# Patient Record
Sex: Male | Born: 1935 | Race: White | Hispanic: No | Smoking: Former smoker
Health system: Southern US, Community
[De-identification: ages and names within clinical notes are randomized; demographics above are authoritative.]

## PROBLEM LIST (undated history)

## (undated) DIAGNOSIS — M1990 Unspecified osteoarthritis, unspecified site: Secondary | ICD-10-CM

## (undated) DIAGNOSIS — I639 Cerebral infarction, unspecified: Secondary | ICD-10-CM

## (undated) DIAGNOSIS — I10 Essential (primary) hypertension: Secondary | ICD-10-CM

## (undated) HISTORY — PX: KNEE ARTHROSCOPY: SUR90

## (undated) HISTORY — PX: CHOLECYSTECTOMY: SHX55

## (undated) HISTORY — PX: BRAIN SURGERY: SHX531

---

## 2003-03-30 ENCOUNTER — Emergency Department (HOSPITAL_COMMUNITY): Admission: EM | Admit: 2003-03-30 | Discharge: 2003-03-30 | Payer: Self-pay | Admitting: Emergency Medicine

## 2003-03-31 ENCOUNTER — Inpatient Hospital Stay (HOSPITAL_COMMUNITY): Admission: EM | Admit: 2003-03-31 | Discharge: 2003-04-05 | Payer: Self-pay | Admitting: Emergency Medicine

## 2003-12-27 ENCOUNTER — Ambulatory Visit: Payer: Self-pay | Admitting: Internal Medicine

## 2003-12-27 ENCOUNTER — Ambulatory Visit (HOSPITAL_COMMUNITY): Admission: RE | Admit: 2003-12-27 | Discharge: 2003-12-27 | Payer: Self-pay | Admitting: Internal Medicine

## 2004-01-28 ENCOUNTER — Ambulatory Visit: Payer: Self-pay | Admitting: Family Medicine

## 2004-05-04 ENCOUNTER — Ambulatory Visit: Payer: Self-pay | Admitting: Family Medicine

## 2004-08-11 ENCOUNTER — Ambulatory Visit: Payer: Self-pay | Admitting: Family Medicine

## 2004-08-20 ENCOUNTER — Ambulatory Visit: Payer: Self-pay | Admitting: Orthopedic Surgery

## 2004-11-26 ENCOUNTER — Ambulatory Visit: Payer: Self-pay | Admitting: Family Medicine

## 2005-04-01 ENCOUNTER — Ambulatory Visit: Payer: Self-pay | Admitting: Family Medicine

## 2005-07-08 ENCOUNTER — Ambulatory Visit: Payer: Self-pay | Admitting: Family Medicine

## 2005-07-29 ENCOUNTER — Ambulatory Visit: Payer: Self-pay | Admitting: Family Medicine

## 2005-08-05 ENCOUNTER — Encounter: Admission: RE | Admit: 2005-08-05 | Discharge: 2005-08-16 | Payer: Self-pay | Admitting: Family Medicine

## 2005-08-14 ENCOUNTER — Emergency Department (HOSPITAL_COMMUNITY): Admission: EM | Admit: 2005-08-14 | Discharge: 2005-08-14 | Payer: Self-pay | Admitting: *Deleted

## 2005-08-17 ENCOUNTER — Ambulatory Visit: Payer: Self-pay | Admitting: Family Medicine

## 2005-09-03 ENCOUNTER — Ambulatory Visit: Payer: Self-pay | Admitting: Family Medicine

## 2005-11-02 ENCOUNTER — Ambulatory Visit: Payer: Self-pay | Admitting: Family Medicine

## 2006-05-13 ENCOUNTER — Ambulatory Visit: Payer: Self-pay | Admitting: Family Medicine

## 2011-09-17 NOTE — Patient Instructions (Addendum)
Your procedure is scheduled on: 09/27/2011  Report to [facility] at   730     AM.  Call this number if you have problems the morning of surgery: [phone]   Do not eat food or drink liquids :After Midnight.      Take these medicines the morning of surgery with A SIP OF WATER: lisinopril   Do not wear jewelry, make-up or nail polish.  Do not wear lotions, powders, or perfumes. You may wear deodorant.  Do not shave 48 hours prior to surgery.  Do not bring valuables to the hospital.  Contacts, dentures or bridgework may not be worn into surgery.  Leave suitcase in the car. After surgery it may be brought to your room.  For patients admitted to the hospital, checkout time is 11:00 AM the day of discharge.   Patients discharged the day of surgery will not be allowed to drive home.  :     Please read over the following fact sheets that you were given: Coughing and Deep Breathing, Surgical Site Infection Prevention, Anesthesia Post-op Instructions and Care and Recovery After Surgery    Cataract A cataract is a clouding of the lens of the eye. When a lens becomes cloudy, vision is reduced based on the degree and nature of the clouding. Many cataracts reduce vision to some degree. Some cataracts make people more near-sighted as they develop. Other cataracts increase glare. Cataracts that are ignored and become worse can sometimes look white. The white color can be seen through the pupil. CAUSES   Aging. However, cataracts may occur at any age, even in newborns.   Certain drugs.   Trauma to the eye.   Certain diseases such as diabetes.   Specific eye diseases such as chronic inflammation inside the eye or a sudden attack of a rare form of glaucoma.   Inherited or acquired medical problems.  SYMPTOMS   Gradual, progressive drop in vision in the affected eye.   Severe, rapid visual loss. This most often happens when trauma is the cause.  DIAGNOSIS  To detect a cataract, an eye doctor  examines the lens. Cataracts are best diagnosed with an exam of the eyes with the pupils enlarged (dilated) by drops.  TREATMENT  For an early cataract, vision may improve by using different eyeglasses or stronger lighting. If that does not help your vision, surgery is the only effective treatment. A cataract needs to be surgically removed when vision loss interferes with your everyday activities, such as driving, reading, or watching TV. A cataract may also have to be removed if it prevents examination or treatment of another eye problem. Surgery removes the cloudy lens and usually replaces it with a substitute lens (intraocular lens, IOL).  At a time when both you and your doctor agree, the cataract will be surgically removed. If you have cataracts in both eyes, only one is usually removed at a time. This allows the operated eye to heal and be out of danger from any possible problems after surgery (such as infection or poor wound healing). In rare cases, a cataract may be doing damage to your eye. In these cases, your caregiver may advise surgical removal right away. The vast majority of people who have cataract surgery have better vision afterward. HOME CARE INSTRUCTIONS  If you are not planning surgery, you may be asked to do the following:  Use different eyeglasses.   Use stronger or brighter lighting.   Ask your eye doctor about reducing  your medicine dose or changing medicines if it is thought that a medicine caused your cataract. Changing medicines does not make the cataract go away on its own.   Become familiar with your surroundings. Poor vision can lead to injury. Avoid bumping into things on the affected side. You are at a higher risk for tripping or falling.   Exercise extreme care when driving or operating machinery.   Wear sunglasses if you are sensitive to bright light or experiencing problems with glare.  SEEK IMMEDIATE MEDICAL CARE IF:   You have a worsening or sudden vision  loss.   You notice redness, swelling, or increasing pain in the eye.   You have a fever.  Document Released: 02/01/2005 Document Revised: 01/21/2011 Document Reviewed: 09/25/2010 [facility] Patient Information 2012 [place].PATIENT INSTRUCTIONS POST-ANESTHESIA  IMMEDIATELY FOLLOWING SURGERY:  Do not drive or operate machinery for the first twenty four hours after surgery.  Do not make any important decisions for twenty four hours after surgery or while taking narcotic pain medications or sedatives.  If you develop intractable nausea and vomiting or a severe headache please notify your doctor immediately.  FOLLOW-UP:  Please make an appointment with your surgeon as instructed. You do not need to follow up with anesthesia unless specifically instructed to do so.  WOUND CARE INSTRUCTIONS (if applicable):  Keep a dry clean dressing on the anesthesia/puncture wound site if there is drainage.  Once the wound has quit draining you may leave it open to air.  Generally you should leave the bandage intact for twenty four hours unless there is drainage.  If the epidural site drains for more than 36-48 hours please call the anesthesia department.  QUESTIONS?:  Please feel free to call your physician or the hospital operator if you have any questions, and they will be happy to assist you.

## 2011-09-20 ENCOUNTER — Encounter (HOSPITAL_COMMUNITY): Payer: Self-pay

## 2011-09-20 ENCOUNTER — Encounter (HOSPITAL_COMMUNITY)
Admission: RE | Admit: 2011-09-20 | Discharge: 2011-09-20 | Disposition: A | Discharge status: Home / Self Care | Payer: Medicare Other | Source: Ambulatory Visit | Attending: Ophthalmology | Admitting: Ophthalmology

## 2011-09-20 ENCOUNTER — Encounter (HOSPITAL_COMMUNITY): Payer: Self-pay | Admitting: Pharmacy Technician

## 2011-09-20 ENCOUNTER — Other Ambulatory Visit: Payer: Self-pay

## 2011-09-20 HISTORY — DX: Unspecified osteoarthritis, unspecified site: M19.90

## 2011-09-20 HISTORY — DX: Essential (primary) hypertension: I10

## 2011-09-20 HISTORY — DX: Cerebral infarction, unspecified: I63.9

## 2011-09-20 LAB — BASIC METABOLIC PANEL
Calcium: 9.9 mg/dL (ref 8.4–10.5)
GFR calc Af Amer: >90 mL/min (ref >90)
GFR calc non Af Amer: 79 mL/min — ABNORMAL LOW (ref >90)
Potassium: 4.1 mEq/L (ref 3.5–5.1)
Sodium: 136 mEq/L (ref 135–145)

## 2011-09-20 LAB — HEMOGLOBIN AND HEMATOCRIT, BLOOD: HCT: 42.1 % (ref 39.0–52.0)

## 2011-09-24 MED ORDER — PHENYLEPHRINE HCL 2.5 % OP SOLN
OPHTHALMIC | Status: AC
  Filled 2011-09-24: qty 2

## 2011-09-24 MED ORDER — CYCLOPENTOLATE HCL 1 % OP SOLN
OPHTHALMIC | Status: AC
  Filled 2011-09-24: qty 2

## 2011-09-24 MED ORDER — LIDOCAINE HCL 3.5 % OP GEL
OPHTHALMIC | Status: AC
  Filled 2011-09-24: qty 5

## 2011-09-24 MED ORDER — LIDOCAINE HCL (PF) 1 % IJ SOLN
INTRAMUSCULAR | Status: AC
  Filled 2011-09-24: qty 2

## 2011-09-24 MED ORDER — TETRACAINE HCL 0.5 % OP SOLN
OPHTHALMIC | Status: AC
  Filled 2011-09-24: qty 2

## 2011-09-24 MED ORDER — NEOMYCIN-POLYMYXIN-DEXAMETH 3.5-10000-0.1 OP OINT
TOPICAL_OINTMENT | OPHTHALMIC | Status: AC
  Filled 2011-09-24: qty 3.5

## 2011-09-27 ENCOUNTER — Encounter (HOSPITAL_COMMUNITY): Payer: Self-pay | Admitting: Anesthesiology

## 2011-09-27 ENCOUNTER — Encounter (HOSPITAL_COMMUNITY): Payer: Self-pay | Admitting: Ophthalmology

## 2011-09-27 ENCOUNTER — Encounter (HOSPITAL_COMMUNITY): Payer: Self-pay | Admitting: *Deleted

## 2011-09-27 ENCOUNTER — Encounter (HOSPITAL_COMMUNITY)
Admission: RE | Disposition: A | Discharge status: Home / Self Care | Payer: Self-pay | Source: Ambulatory Visit | Attending: Ophthalmology

## 2011-09-27 ENCOUNTER — Ambulatory Visit (HOSPITAL_COMMUNITY): Payer: Medicare Other | Admitting: Anesthesiology

## 2011-09-27 ENCOUNTER — Ambulatory Visit (HOSPITAL_COMMUNITY)
Admission: RE | Admit: 2011-09-27 | Discharge: 2011-09-27 | Disposition: A | Discharge status: Home / Self Care | Payer: Medicare Other | Source: Ambulatory Visit | Attending: Ophthalmology | Admitting: Ophthalmology

## 2011-09-27 DIAGNOSIS — I10 Essential (primary) hypertension: Secondary | ICD-10-CM | POA: Insufficient documentation

## 2011-09-27 DIAGNOSIS — Z01812 Encounter for preprocedural laboratory examination: Secondary | ICD-10-CM | POA: Insufficient documentation

## 2011-09-27 DIAGNOSIS — Z79899 Other long term (current) drug therapy: Secondary | ICD-10-CM | POA: Insufficient documentation

## 2011-09-27 DIAGNOSIS — E119 Type 2 diabetes mellitus without complications: Secondary | ICD-10-CM | POA: Insufficient documentation

## 2011-09-27 DIAGNOSIS — H2510 Age-related nuclear cataract, unspecified eye: Secondary | ICD-10-CM | POA: Insufficient documentation

## 2011-09-27 DIAGNOSIS — Z01810 Encounter for preprocedural cardiovascular examination: Secondary | ICD-10-CM | POA: Insufficient documentation

## 2011-09-27 HISTORY — PX: CATARACT EXTRACTION W/PHACO: SHX586

## 2011-09-27 SURGERY — PHACOEMULSIFICATION, CATARACT, WITH IOL INSERTION
Anesthesia: Monitor Anesthesia Care | Site: Eye | Laterality: Right | Wound class: Clean

## 2011-09-27 MED ORDER — TETRACAINE HCL 0.5 % OP SOLN
1.0000 [drp] | OPHTHALMIC | Status: AC
  Administered 2011-09-27 (×3): 1 [drp] via OPHTHALMIC

## 2011-09-27 MED ORDER — MIDAZOLAM HCL 2 MG/2ML IJ SOLN
1.0000 mg | INTRAMUSCULAR | Status: DC | PRN
  Administered 2011-09-27: 2 mg via INTRAVENOUS

## 2011-09-27 MED ORDER — EPINEPHRINE HCL 1 MG/ML IJ SOLN
INTRAOCULAR | Status: DC | PRN
  Administered 2011-09-27: 09:00:00

## 2011-09-27 MED ORDER — TRYPAN BLUE 0.06 % OP SOLN
OPHTHALMIC | Status: DC | PRN
  Administered 2011-09-27: 0.5 mL via INTRAOCULAR

## 2011-09-27 MED ORDER — BSS IO SOLN
INTRAOCULAR | Status: DC | PRN
  Administered 2011-09-27: 15 mL via INTRAOCULAR

## 2011-09-27 MED ORDER — LIDOCAINE HCL (PF) 1 % IJ SOLN
INTRAMUSCULAR | Status: DC | PRN
  Administered 2011-09-27: .6 mL

## 2011-09-27 MED ORDER — PROVISC 10 MG/ML IO SOLN
INTRAOCULAR | Status: DC | PRN
  Administered 2011-09-27: 8.5 mg via INTRAOCULAR

## 2011-09-27 MED ORDER — PHENYLEPHRINE HCL 2.5 % OP SOLN
1.0000 [drp] | OPHTHALMIC | Status: AC
  Administered 2011-09-27 (×3): 1 [drp] via OPHTHALMIC

## 2011-09-27 MED ORDER — EPINEPHRINE HCL 1 MG/ML IJ SOLN
INTRAMUSCULAR | Status: AC
  Filled 2011-09-27: qty 1

## 2011-09-27 MED ORDER — LIDOCAINE HCL 3.5 % OP GEL
1.0000 "application " | Freq: Once | OPHTHALMIC | Status: AC
  Administered 2011-09-27: 1 via OPHTHALMIC

## 2011-09-27 MED ORDER — CYCLOPENTOLATE HCL 1 % OP SOLN
1.0000 [drp] | OPHTHALMIC | Status: AC
  Administered 2011-09-27 (×3): 1 [drp] via OPHTHALMIC

## 2011-09-27 MED ORDER — POVIDONE-IODINE 5 % OP SOLN
OPHTHALMIC | Status: DC | PRN
  Administered 2011-09-27: 1 via OPHTHALMIC

## 2011-09-27 MED ORDER — LACTATED RINGERS IV SOLN
INTRAVENOUS | Status: DC | PRN
  Administered 2011-09-27: 08:00:00 via INTRAVENOUS

## 2011-09-27 MED ORDER — TRYPAN BLUE 0.06 % OP SOLN
OPHTHALMIC | Status: AC
  Filled 2011-09-27: qty 0.5

## 2011-09-27 MED ORDER — LACTATED RINGERS IV SOLN
INTRAVENOUS | Status: DC
  Administered 2011-09-27: 1000 mL via INTRAVENOUS

## 2011-09-27 MED ORDER — LIDOCAINE 3.5 % OP GEL OPTIME - NO CHARGE
OPHTHALMIC | Status: DC | PRN
  Administered 2011-09-27: 1 [drp] via OPHTHALMIC

## 2011-09-27 MED ORDER — MIDAZOLAM HCL 2 MG/2ML IJ SOLN
INTRAMUSCULAR | Status: AC
  Filled 2011-09-27: qty 2

## 2011-09-27 MED ORDER — NEOMYCIN-POLYMYXIN-DEXAMETH 0.1 % OP OINT
TOPICAL_OINTMENT | OPHTHALMIC | Status: DC | PRN
  Administered 2011-09-27: 1 via OPHTHALMIC

## 2011-09-27 SURGICAL SUPPLY — 32 items

## 2011-09-27 NOTE — Preoperative (Signed)
Beta Blockers   Reason not to administer Beta Blockers:Not Applicable 

## 2011-09-27 NOTE — Anesthesia Preprocedure Evaluation (Signed)
Anesthesia Evaluation  Patient identified by MRN, date of birth, ID band Patient awake    Reviewed: Allergy & Precautions, H&P , NPO status , Patient's Chart, lab work & pertinent test results  History of Anesthesia Complications Negative for: history of anesthetic complications  Airway Mallampati: II      Dental  (+) Edentulous Upper   Pulmonary  breath sounds clear to auscultation        Cardiovascular hypertension, Pt. on medications Rhythm:Regular     Neuro/Psych CVA, Residual Symptoms    GI/Hepatic   Endo/Other  Well Controlled, Type 2  Renal/GU      Musculoskeletal   Abdominal   Peds  Hematology   Anesthesia Other Findings   Reproductive/Obstetrics                           Anesthesia Physical Anesthesia Plan  ASA: III  Anesthesia Plan: MAC   Post-op Pain Management:    Induction: Intravenous  Airway Management Planned: Nasal Cannula  Additional Equipment:   Intra-op Plan:   Post-operative Plan:   Informed Consent: I have reviewed the patients History and Physical, chart, labs and discussed the procedure including the risks, benefits and alternatives for the proposed anesthesia with the patient or authorized representative who has indicated his/her understanding and acceptance.     Plan Discussed with:   Anesthesia Plan Comments:         Anesthesia Quick Evaluation

## 2011-09-27 NOTE — H&P (Signed)
I have reviewed the H&P, the patient was re-examined, and I have identified no interval changes in medical condition and plan of care since the history and physical of record  

## 2011-09-27 NOTE — Brief Op Note (Signed)
Pre-Op Dx: Cataract OD Post-Op Dx: Cataract OD Surgeon: Judithe Keetch Anesthesia: Topical with MAC Surgery: Cataract Extraction with Intraocular lens Implant OD Implant: Lenstec, Model Softec HD Blood Loss: None Specimen: None Complications: None 

## 2011-09-27 NOTE — Anesthesia Postprocedure Evaluation (Signed)
  Anesthesia Post-op Note  Patient: Shannon Cooke  Procedure(s) Performed: Procedure(s) (LRB): CATARACT EXTRACTION PHACO AND INTRAOCULAR LENS PLACEMENT (IOC) (Right)  Patient Location: Short Stay  Anesthesia Type: MAC  Level of Consciousness: awake, alert  and oriented  Airway and Oxygen Therapy: Patient Spontanous Breathing  Post-op Pain: none  Post-op Assessment: Post-op Vital signs reviewed, Patient's Cardiovascular Status Stable, Respiratory Function Stable, Patent Airway, No signs of Nausea or vomiting and Pain level controlled  Post-op Vital Signs: Reviewed and stable  Complications: No apparent anesthesia complications

## 2011-09-27 NOTE — Transfer of Care (Signed)
Immediate Anesthesia Transfer of Care Note  Patient: Shannon Cooke  Procedure(s) Performed: Procedure(s) (LRB): CATARACT EXTRACTION PHACO AND INTRAOCULAR LENS PLACEMENT (IOC) (Right)  Patient Location: Short Stay  Anesthesia Type: MAC  Level of Consciousness: awake, alert  and oriented  Airway & Oxygen Therapy: Patient Spontanous Breathing  Post-op Assessment: Report given to PACU RN  Post vital signs: Reviewed and stable  Complications: No apparent anesthesia complications

## 2011-09-28 ENCOUNTER — Encounter (HOSPITAL_COMMUNITY): Payer: Self-pay | Admitting: Ophthalmology

## 2011-09-28 NOTE — Op Note (Signed)
[name], Shannon Cooke NO.:  [id number]  MEDICAL RECORD NO.:  [id number]  LOCATION:  [facility]                          FACILITY:  [facility]  PHYSICIAN:  Susanne Greenhouse, MD       DATE OF BIRTH:  Jun 27, 1933  DATE OF PROCEDURE:  09/27/2011 DATE OF DISCHARGE:  09/27/2011                              OPERATIVE REPORT   PREOPERATIVE DIAGNOSIS:  Nuclear cataract, right eye.  POSTOPERATIVE DIAGNOSIS:  Nuclear cataract, right eye.  DIAGNOSIS CODE:  366.16.  ANESTHESIA:  Topical with monitored anesthesia care and IV sedation.  SURGEON:  Susanne Greenhouse, MD  DESCRIPTION OF THE OPERATION:  In the preoperative area, dilating drops and viscous lidocaine were placed into the right eye.  The patient was then brought into the operating room where he was prepped and draped. Beginning with a 75 blade, a paracentesis was made at the surgeon's 2 o'clock position.  The anterior chamber was then filled with a 1% nonpreserved lidocaine solution.  This was followed by filling the anterior chamber with VisionBlue to stain the anterior capsule.  The VisionBlue was displaced from the anterior chamber with balanced salt solution on a fine cannula.  This was followed by filling the anterior chamber with Viscoat.  A 2.4-mm keratome blade was then used to make a clear corneal incision of the temporal limbus.  A bent cystotome needle and Utrata forceps were used to create a continuous tear capsulotomy. Hydrodissection was performed with balanced salt solution on a fine cannula.  A lens nucleus was then removed using phacoemulsification in a quadrant cracking technique.  Residual cortex was removed with irrigation and aspiration.  The capsular bag and anterior chamber were refilled with Provisc and a posterior chamber intraocular lens was placed into the capsular bag without difficulty.  The Provisc was then removed from the capsular bag and anterior chamber with irrigation and aspiration.  Stromal  hydration of the main incision and paracentesis ports were performed with balanced salt solution on a fine cannula.  The wounds were tested for leak, which were negative.  The patient tolerated the procedure well.  There were no operative complications, and he was returned to the recovery room in satisfactory condition.  Prosthetic device used was a Lenstec posterior chamber lens, model Softec HD, power of 18.75, serial number is [id number].  There were no surgical specimens.          ______________________________ Susanne Greenhouse, MD     KEH/MEDQ  D:  09/27/2011  T:  09/28/2011  Job:  [id number]

## 2016-07-13 NOTE — Patient Instructions (Addendum)
Shannon PuntJames R Cooke  07/13/2016     @PREFPERIOPPHARMACY @   Your procedure is scheduled on 07/26/2016 [facility] at [facility]:30 AM  Call this number if you have problems the morning of surgery:  [phone]   Remember:  Do not eat food or drink liquids after midnight.  Take these medicines the morning of surgery with A SIP OF WATER : Lisinopril   Do not wear jewelry, make-up or nail polish.  Do not wear lotions, powders, or perfumes, or deoderant.  Do not shave 48 hours prior to surgery.  Men may shave face and neck.  Do not bring valuables to the hospital.  [facility] is not responsible for any belongings or valuables.  Contacts, dentures or bridgework may not be worn into surgery.  Leave your suitcase in the car.  After surgery it may be brought to your room.  For patients admitted to the hospital, discharge time will be determined by your treatment team.  Patients discharged the day of surgery will not be allowed to drive home.   Name and phone number of your driver:   family Special instructions:  n/a  Please read over the following fact sheets that you were given. Care and Recovery After Surgery    Cataract Surgery Cataract surgery is a procedure to remove a cataract from your eye. A cataract is cloudiness on the lens of your eye. The lens focuses light inside the eye. When a lens becomes cloudy, your vision is affected. Cataract surgery is a procedure to remove the cloudy lens. A substitute lens (intraocular lens or IOL) is usually inserted as a replacement for the cloudy lens. Tell a health care provider about:  Any allergies you have.  All medicines you are taking, including vitamins, herbs, eye drops, creams, and over-the-counter medicines.  Any problems you or family members have had with anesthetic medicines.  Any blood disorders you have.  Any surgeries you have had, especially eye surgeries that include refractive surgery, such as PRK and LASIK.  Any  medical conditions you have.  Whether you are pregnant or may be pregnant. What are the risks? Generally, this is a safe procedure. However, problems may occur, including:  Infection.  Bleeding.  Glaucoma.  Retinal detachment.  Allergic reactions to medicines.  Damage to other structures or organs.  Inflammation of the eye.  Clouding of the part of your eye that holds an IOL in place (after-cataract), if an IOL was inserted. This is fairly common.  An IOL moving out of position, if an IOL was inserted. This is very rare.  Loss of vision. This is rare. What happens before the procedure?  Follow instructions from your health care provider about eating or drinking restrictions.  Ask your health care provider about:  Changing or stopping your regular medicines, including any eye drops you have been prescribed. This is especially important if you are taking diabetes medicines or blood thinners.  Taking medicines such as aspirin and ibuprofen. These medicines can thin your blood. Do not take these medicines before your procedure if your health care provider instructs you not to.  Do not put contact lenses in either eye on the day of your surgery.  Plan for someone to drive you to and from the procedure.  If you will be going home right after the procedure, plan to have someone with you for 24 hours. What happens during the procedure?  An IV tube may be inserted into one of your veins.  You will be given one or more of the following:  A medicine to help you relax (sedative).  A medicine to numb the area (local anesthetic). This may be numbing eye drops or an injection that is given behind the eye.  A small cut (incision) will be made to the edge of the clear, dome-shaped surface that covers the front of the eye (cornea).  A small probe will be inserted into the eye. This device gives off ultrasound waves that soften and break up the cloudy center of the lens. This makes  it easier for the cloudy lens to be removed by suction.  An IOL may be implanted.  Part of the capsule that surrounds the lens will be left in the eye to support the IOL.  Your surgeon may use stitches (sutures) to close the incision. The procedure may vary among health care providers and hospitals. What happens after the procedure?  Your blood pressure, heart rate, breathing rate, and blood oxygen level will be monitored often until the medicines you were given have worn off.  You may be given a protective shield to wear over your eyes.  Do not drive for 24 hours if you received a sedative. This information is not intended to replace advice given to you by your health care provider. Make sure you discuss any questions you have with your health care provider. Document Released: 01/21/2011 Document Revised: 07/10/2015 Document Reviewed: 12/12/2014 Elsevier Interactive Patient Education  2017 [employer].

## 2016-07-15 ENCOUNTER — Encounter (HOSPITAL_COMMUNITY): Payer: Self-pay

## 2016-07-15 ENCOUNTER — Encounter (HOSPITAL_COMMUNITY)
Admission: RE | Admit: 2016-07-15 | Discharge: 2016-07-15 | Disposition: A | Discharge status: Home / Self Care | Payer: Medicare Other | Source: Ambulatory Visit | Attending: Ophthalmology | Admitting: Ophthalmology

## 2016-07-15 DIAGNOSIS — Z01812 Encounter for preprocedural laboratory examination: Secondary | ICD-10-CM | POA: Insufficient documentation

## 2016-07-15 DIAGNOSIS — Z01818 Encounter for other preprocedural examination: Secondary | ICD-10-CM | POA: Diagnosis present

## 2016-07-15 DIAGNOSIS — R9431 Abnormal electrocardiogram [ECG] [EKG]: Secondary | ICD-10-CM | POA: Insufficient documentation

## 2016-07-15 DIAGNOSIS — H2512 Age-related nuclear cataract, left eye: Secondary | ICD-10-CM | POA: Insufficient documentation

## 2016-07-15 LAB — BASIC METABOLIC PANEL
Anion gap: 5 (ref 5–15)
BUN: 20 mg/dL (ref 6–20)
CALCIUM: 9.1 mg/dL (ref 8.9–10.3)
CHLORIDE: 106 mmol/L (ref 101–111)
CO2: 26 mmol/L (ref 22–32)
Creatinine, Ser: 0.87 mg/dL (ref 0.61–1.24)
GFR calc non Af Amer: >60 mL/min (ref >60)
Glucose, Bld: 109 mg/dL — ABNORMAL HIGH (ref 65–99)
Potassium: 4 mmol/L (ref 3.5–5.1)
SODIUM: 137 mmol/L (ref 135–145)

## 2016-07-15 LAB — CBC WITH DIFFERENTIAL/PLATELET
BASOS PCT: 0 %
Basophils Absolute: 0 10*3/uL (ref 0.0–0.1)
EOS ABS: 0.1 10*3/uL (ref 0.0–0.7)
EOS PCT: 1 %
HCT: 39.8 % (ref 39.0–52.0)
Hemoglobin: 13.5 g/dL (ref 13.0–17.0)
LYMPHS ABS: 2.1 10*3/uL (ref 0.7–4.0)
Lymphocytes Relative: 27 %
MCH: 33.4 pg (ref 26.0–34.0)
MCHC: 33.9 g/dL (ref 30.0–36.0)
MCV: 98.5 fL (ref 78.0–100.0)
MONO ABS: 1 10*3/uL (ref 0.1–1.0)
MONOS PCT: 13 %
Neutro Abs: 4.6 10*3/uL (ref 1.7–7.7)
Neutrophils Relative %: 59 %
PLATELETS: 125 10*3/uL — AB (ref 150–400)
RBC: 4.04 MIL/uL — ABNORMAL LOW (ref 4.22–5.81)
RDW: 13.2 % (ref 11.5–15.5)
WBC: 7.7 10*3/uL (ref 4.0–10.5)

## 2016-07-23 MED ORDER — LIDOCAINE HCL (PF) 1 % IJ SOLN
INTRAMUSCULAR | Status: AC
  Filled 2016-07-23: qty 2

## 2016-07-23 MED ORDER — LIDOCAINE HCL 3.5 % OP GEL
OPHTHALMIC | Status: AC
  Filled 2016-07-23: qty 1

## 2016-07-23 MED ORDER — CYCLOPENTOLATE-PHENYLEPHRINE 0.2-1 % OP SOLN
OPHTHALMIC | Status: AC
  Filled 2016-07-23: qty 2

## 2016-07-23 MED ORDER — TETRACAINE HCL 0.5 % OP SOLN
OPHTHALMIC | Status: AC
  Filled 2016-07-23: qty 4

## 2016-07-23 MED ORDER — PHENYLEPHRINE HCL 2.5 % OP SOLN
OPHTHALMIC | Status: AC
  Filled 2016-07-23: qty 15

## 2016-07-23 MED ORDER — NEOMYCIN-POLYMYXIN-DEXAMETH 3.5-10000-0.1 OP SUSP
OPHTHALMIC | Status: AC
  Filled 2016-07-23: qty 5

## 2016-07-26 ENCOUNTER — Encounter (HOSPITAL_COMMUNITY)
Admission: RE | Disposition: A | Discharge status: Home / Self Care | Payer: Self-pay | Source: Ambulatory Visit | Attending: Ophthalmology

## 2016-07-26 ENCOUNTER — Ambulatory Visit (HOSPITAL_COMMUNITY): Payer: Medicare Other | Admitting: Anesthesiology

## 2016-07-26 ENCOUNTER — Ambulatory Visit (HOSPITAL_COMMUNITY)
Admission: RE | Admit: 2016-07-26 | Discharge: 2016-07-26 | Disposition: A | Discharge status: Home / Self Care | Payer: Medicare Other | Source: Ambulatory Visit | Attending: Ophthalmology | Admitting: Ophthalmology

## 2016-07-26 ENCOUNTER — Encounter (HOSPITAL_COMMUNITY): Payer: Self-pay | Admitting: *Deleted

## 2016-07-26 DIAGNOSIS — E119 Type 2 diabetes mellitus without complications: Secondary | ICD-10-CM | POA: Insufficient documentation

## 2016-07-26 DIAGNOSIS — Z87891 Personal history of nicotine dependence: Secondary | ICD-10-CM | POA: Insufficient documentation

## 2016-07-26 DIAGNOSIS — H2512 Age-related nuclear cataract, left eye: Secondary | ICD-10-CM | POA: Insufficient documentation

## 2016-07-26 DIAGNOSIS — Z79899 Other long term (current) drug therapy: Secondary | ICD-10-CM | POA: Insufficient documentation

## 2016-07-26 DIAGNOSIS — I10 Essential (primary) hypertension: Secondary | ICD-10-CM | POA: Diagnosis not present

## 2016-07-26 DIAGNOSIS — Z7982 Long term (current) use of aspirin: Secondary | ICD-10-CM | POA: Insufficient documentation

## 2016-07-26 DIAGNOSIS — M1990 Unspecified osteoarthritis, unspecified site: Secondary | ICD-10-CM | POA: Diagnosis not present

## 2016-07-26 DIAGNOSIS — I6930 Unspecified sequelae of cerebral infarction: Secondary | ICD-10-CM | POA: Insufficient documentation

## 2016-07-26 HISTORY — PX: CATARACT EXTRACTION W/PHACO: SHX586

## 2016-07-26 LAB — GLUCOSE, CAPILLARY: GLUCOSE-CAPILLARY: 90 mg/dL (ref 65–99)

## 2016-07-26 SURGERY — PHACOEMULSIFICATION, CATARACT, WITH IOL INSERTION
Anesthesia: Monitor Anesthesia Care | Site: Eye | Laterality: Left

## 2016-07-26 MED ORDER — BSS IO SOLN
INTRAOCULAR | Status: DC | PRN
  Administered 2016-07-26: 500 mL

## 2016-07-26 MED ORDER — NEOMYCIN-POLYMYXIN-DEXAMETH 3.5-10000-0.1 OP SUSP
OPHTHALMIC | Status: DC | PRN
  Administered 2016-07-26: 1 [drp] via OPHTHALMIC

## 2016-07-26 MED ORDER — MIDAZOLAM HCL 2 MG/2ML IJ SOLN
INTRAMUSCULAR | Status: AC
  Filled 2016-07-26: qty 2

## 2016-07-26 MED ORDER — LIDOCAINE HCL 3.5 % OP GEL
1.0000 "application " | Freq: Once | OPHTHALMIC | Status: AC
  Administered 2016-07-26: 1 via OPHTHALMIC

## 2016-07-26 MED ORDER — LIDOCAINE HCL (PF) 1 % IJ SOLN
INTRAMUSCULAR | Status: DC | PRN
  Administered 2016-07-26: .6 mL

## 2016-07-26 MED ORDER — CYCLOPENTOLATE-PHENYLEPHRINE 0.2-1 % OP SOLN
1.0000 [drp] | OPHTHALMIC | Status: AC
  Administered 2016-07-26 (×3): 1 [drp] via OPHTHALMIC

## 2016-07-26 MED ORDER — TETRACAINE HCL 0.5 % OP SOLN
1.0000 [drp] | OPHTHALMIC | Status: AC
  Administered 2016-07-26 (×3): 1 [drp] via OPHTHALMIC

## 2016-07-26 MED ORDER — FENTANYL CITRATE (PF) 100 MCG/2ML IJ SOLN: 25.0000 ug | Freq: Once | INTRAMUSCULAR | Status: DC

## 2016-07-26 MED ORDER — MIDAZOLAM HCL 2 MG/2ML IJ SOLN
1.0000 mg | INTRAMUSCULAR | Status: AC
  Administered 2016-07-26: 2 mg via INTRAVENOUS

## 2016-07-26 MED ORDER — LACTATED RINGERS IV SOLN
INTRAVENOUS | Status: DC
  Administered 2016-07-26: 09:00:00 via INTRAVENOUS

## 2016-07-26 MED ORDER — FENTANYL CITRATE (PF) 100 MCG/2ML IJ SOLN
INTRAMUSCULAR | Status: AC
  Filled 2016-07-26: qty 2

## 2016-07-26 MED ORDER — BSS IO SOLN
INTRAOCULAR | Status: DC | PRN
  Administered 2016-07-26: 15 mL via INTRAOCULAR

## 2016-07-26 MED ORDER — PHENYLEPHRINE HCL 2.5 % OP SOLN
1.0000 [drp] | OPHTHALMIC | Status: AC
  Administered 2016-07-26 (×3): 1 [drp] via OPHTHALMIC

## 2016-07-26 MED ORDER — POVIDONE-IODINE 5 % OP SOLN
OPHTHALMIC | Status: DC | PRN
  Administered 2016-07-26: 1 via OPHTHALMIC

## 2016-07-26 MED ORDER — NA HYALUR & NA CHOND-NA HYALUR 0.55-0.5 ML IO KIT
PACK | INTRAOCULAR | Status: DC | PRN
  Administered 2016-07-26: 1 via OPHTHALMIC

## 2016-07-26 SURGICAL SUPPLY — 9 items
CLOTH BEACON ORANGE TIMEOUT ST (SAFETY) ×3 IMPLANT
EYE SHIELD UNIVERSAL CLEAR (GAUZE/BANDAGES/DRESSINGS) ×3 IMPLANT
GLOVE BIOGEL PI IND STRL 7.0 (GLOVE) ×2 IMPLANT
GLOVE BIOGEL PI INDICATOR 7.0 (GLOVE) ×4
PAD ARMBOARD 7.5X6 YLW CONV (MISCELLANEOUS) ×3 IMPLANT
SIGHTPATH CAT PROC W REG LENS (Ophthalmic Related) ×3 IMPLANT
SYRINGE LUER LOK 1CC (MISCELLANEOUS) ×3 IMPLANT
TAPE TRANSPARENT 1/2IN (GAUZE/BANDAGES/DRESSINGS) ×3 IMPLANT
WATER STERILE IRR 250ML POUR (IV SOLUTION) ×3 IMPLANT

## 2016-07-26 NOTE — Anesthesia Procedure Notes (Signed)
Procedure Name: MAC Date/Time: 07/26/2016 10:07 AM Performed by: Vista Deck Pre-anesthesia Checklist: Patient identified, Emergency Drugs available, Suction available, Timeout performed and Patient being monitored Patient Re-evaluated:Patient Re-evaluated prior to inductionOxygen Delivery Method: Nasal Cannula

## 2016-07-26 NOTE — H&P (Signed)
I have reviewed the H&P, the patient was re-examined, and I have identified no interval changes in medical condition and plan of care since the history and physical of record  

## 2016-07-26 NOTE — Anesthesia Postprocedure Evaluation (Signed)
Anesthesia Post Note  Patient: Shannon Fredrickson Sr.  Procedure(s) Performed: Procedure(s) (LRB): CATARACT EXTRACTION PHACO AND INTRAOCULAR LENS PLACEMENT ([place]) (Left)  Patient location during evaluation: Short Stay Anesthesia Type: MAC Level of consciousness: awake and alert Pain management: satisfactory to patient Vital Signs Assessment: post-procedure vital signs reviewed and stable Respiratory status: spontaneous breathing Cardiovascular status: stable Postop Assessment: no signs of nausea or vomiting and adequate PO intake Anesthetic complications: no     Last Vitals:  Vitals:   07/26/16 0945 07/26/16 0950  BP: (!) 84/49 (!) 87/52  Resp: 17 16  Temp:      Last Pain:  Vitals:   07/26/16 0905  TempSrc: Oral                 Harold Mattes

## 2016-07-26 NOTE — Transfer of Care (Signed)
Immediate Anesthesia Transfer of Care Note  Patient: Shannon Fredrickson Sr.  Procedure(s) Performed: Procedure(s) (LRB): CATARACT EXTRACTION PHACO AND INTRAOCULAR LENS PLACEMENT (IOC) (Left)  Patient Location: Shortstay  Anesthesia Type: MAC  Level of Consciousness: awake  Airway & Oxygen Therapy: Patient Spontanous Breathing   Post-op Assessment: Report given to PACU RN, Post -op Vital signs reviewed and stable and Patient moving all extremities  Post vital signs: Reviewed and stable  Complications: No apparent anesthesia complications

## 2016-07-26 NOTE — Anesthesia Preprocedure Evaluation (Signed)
Anesthesia Evaluation  Patient identified by MRN, date of birth, ID band Patient awake    Reviewed: Allergy & Precautions, H&P , NPO status , Patient's Chart, lab work & pertinent test results  History of Anesthesia Complications Negative for: history of anesthetic complications  Airway Mallampati: II       Dental  (+) Edentulous Upper   Pulmonary former smoker,    breath sounds clear to auscultation       Cardiovascular hypertension, Pt. on medications  Rhythm:Regular     Neuro/Psych CVA, Residual Symptoms    GI/Hepatic   Endo/Other  diabetes, Well Controlled, Type 2  Renal/GU      Musculoskeletal   Abdominal   Peds  Hematology   Anesthesia Other Findings   Reproductive/Obstetrics                             Anesthesia Physical Anesthesia Plan  ASA: III  Anesthesia Plan: MAC   Post-op Pain Management:    Induction: Intravenous  PONV Risk Score and Plan:   Airway Management Planned: Nasal Cannula  Additional Equipment:   Intra-op Plan:   Post-operative Plan:   Informed Consent: I have reviewed the patients History and Physical, chart, labs and discussed the procedure including the risks, benefits and alternatives for the proposed anesthesia with the patient or authorized representative who has indicated his/her understanding and acceptance.     Plan Discussed with:   Anesthesia Plan Comments:         Anesthesia Quick Evaluation

## 2016-07-26 NOTE — Discharge Instructions (Signed)
Monitored Anesthesia Care, Care After  These instructions provide you with information about caring for yourself after your procedure. Your health care provider may also give you more specific instructions. Your treatment has been planned according to current medical practices, but problems sometimes occur. Call your health care provider if you have any problems or questions after your procedure.  What can I expect after the procedure?  After your procedure, it is common to:   Feel sleepy for several hours.   Feel clumsy and have poor balance for several hours.   Feel forgetful about what happened after the procedure.   Have poor judgment for several hours.   Feel nauseous or vomit.   Have a sore throat if you had a breathing tube during the procedure.    Follow these instructions at home:  For at least 24 hours after the procedure:     Do not:  ? Participate in activities in which you could fall or become injured.  ? Drive.  ? Use heavy machinery.  ? Drink alcohol.  ? Take sleeping pills or medicines that cause drowsiness.  ? Make important decisions or sign legal documents.  ? Take care of children on your own.   Rest.  Eating and drinking   Follow the diet that is recommended by your health care provider.   If you vomit, drink water, juice, or soup when you can drink without vomiting.   Make sure you have little or no nausea before eating solid foods.  General instructions   Have a responsible adult stay with you until you are awake and alert.   Take over-the-counter and prescription medicines only as told by your health care provider.   If you smoke, do not smoke without supervision.   Keep all follow-up visits as told by your health care provider. This is important.  Contact a health care provider if:   You keep feeling nauseous or you keep vomiting.   You feel light-headed.   You develop a rash.   You have a fever.  Get help right away if:   You have trouble breathing.  This information is  not intended to replace advice given to you by your health care provider. Make sure you discuss any questions you have with your health care provider.  Document Released: 05/25/2015 Document Revised: 09/24/2015 Document Reviewed: 05/25/2015  Elsevier Interactive Patient Education  2018 Elsevier Inc[employer]

## 2016-07-26 NOTE — Op Note (Signed)
Date of Admission: 07/26/2016  Date of Surgery: 07/26/2016  Pre-Op Dx: Cataract Left  Eye  Post-Op Dx: Senile Nuclear Cataract  Left  Eye,  Dx Code H25.12  Surgeon: Tonny Branch, M.D.  Assistants: None  Anesthesia: Topical with MAC  Indications: Painless, progressive loss of vision with compromise of daily activities.  Surgery: Cataract Extraction with Intraocular lens Implant Left Eye  Discription: The patient had dilating drops and viscous lidocaine placed into the Left eye in the pre-op holding area. After transfer to the operating room, a time out was performed. The patient was then prepped and draped. Beginning with a 36m blade a paracentesis port was made at the surgeon's 2 o'clock position. The anterior chamber was then filled with 1% non-preserved lidocaine. This was followed by filling the anterior chamber with Viscoat.  A 2.428mkeratome blade was used to make a clear corneal incision at the temporal limbus.  A bent cystatome needle was used to create a continuous tear capsulotomy. Hydrodissection was performed with balanced salt solution on a Fine canula. The lens nucleus was then removed using the phacoemulsification handpiece. Residual cortex was removed with the I&A handpiece. The anterior chamber and capsular bag were refilled with Provisc. A posterior chamber intraocular lens was placed into the capsular bag with it's injector. The implant was positioned with the Kuglan hook. The Provisc was then removed from the anterior chamber and capsular bag with the I&A handpiece. Stromal hydration of the main incision and paracentesis port was performed with BSS on a Fine canula. The wounds were tested for leak which was negative. The patient tolerated the procedure well. There were no operative complications. The patient was then transferred to the recovery room in stable condition.  Complications: None  Specimen: None  EBL: None  Prosthetic device: Abbott Technis, PCB00, power 20.0, SN  [id number]

## 2016-07-28 ENCOUNTER — Encounter (HOSPITAL_COMMUNITY): Payer: Self-pay | Admitting: Ophthalmology

## 2018-09-11 ENCOUNTER — Emergency Department (HOSPITAL_COMMUNITY): Payer: Medicare Other

## 2018-09-11 ENCOUNTER — Inpatient Hospital Stay (HOSPITAL_COMMUNITY)
Admission: EM | Admit: 2018-09-11 | Discharge: 2018-09-20 | DRG: 481 | Disposition: A | Discharge status: Skilled Nursing Facility | Payer: Medicare Other | Attending: Internal Medicine | Admitting: Internal Medicine

## 2018-09-11 ENCOUNTER — Encounter (HOSPITAL_COMMUNITY): Payer: Self-pay | Admitting: Emergency Medicine

## 2018-09-11 ENCOUNTER — Other Ambulatory Visit: Payer: Self-pay

## 2018-09-11 ENCOUNTER — Inpatient Hospital Stay (HOSPITAL_COMMUNITY): Payer: Medicare Other

## 2018-09-11 DIAGNOSIS — D62 Acute posthemorrhagic anemia: Secondary | ICD-10-CM | POA: Diagnosis not present

## 2018-09-11 DIAGNOSIS — I4892 Unspecified atrial flutter: Secondary | ICD-10-CM | POA: Diagnosis not present

## 2018-09-11 DIAGNOSIS — E119 Type 2 diabetes mellitus without complications: Secondary | ICD-10-CM | POA: Diagnosis present

## 2018-09-11 DIAGNOSIS — S72002A Fracture of unspecified part of neck of left femur, initial encounter for closed fracture: Secondary | ICD-10-CM

## 2018-09-11 DIAGNOSIS — I10 Essential (primary) hypertension: Secondary | ICD-10-CM | POA: Diagnosis present

## 2018-09-11 DIAGNOSIS — R5082 Postprocedural fever: Secondary | ICD-10-CM | POA: Diagnosis not present

## 2018-09-11 DIAGNOSIS — Z7984 Long term (current) use of oral hypoglycemic drugs: Secondary | ICD-10-CM

## 2018-09-11 DIAGNOSIS — J9811 Atelectasis: Secondary | ICD-10-CM | POA: Diagnosis not present

## 2018-09-11 DIAGNOSIS — N400 Enlarged prostate without lower urinary tract symptoms: Secondary | ICD-10-CM | POA: Diagnosis present

## 2018-09-11 DIAGNOSIS — Z8673 Personal history of transient ischemic attack (TIA), and cerebral infarction without residual deficits: Secondary | ICD-10-CM

## 2018-09-11 DIAGNOSIS — Z9841 Cataract extraction status, right eye: Secondary | ICD-10-CM | POA: Diagnosis not present

## 2018-09-11 DIAGNOSIS — Z79899 Other long term (current) drug therapy: Secondary | ICD-10-CM

## 2018-09-11 DIAGNOSIS — I639 Cerebral infarction, unspecified: Secondary | ICD-10-CM | POA: Diagnosis present

## 2018-09-11 DIAGNOSIS — Z8781 Personal history of (healed) traumatic fracture: Secondary | ICD-10-CM | POA: Diagnosis not present

## 2018-09-11 DIAGNOSIS — S72141A Displaced intertrochanteric fracture of right femur, initial encounter for closed fracture: Secondary | ICD-10-CM | POA: Diagnosis not present

## 2018-09-11 DIAGNOSIS — W1830XA Fall on same level, unspecified, initial encounter: Secondary | ICD-10-CM | POA: Diagnosis present

## 2018-09-11 DIAGNOSIS — S72142A Displaced intertrochanteric fracture of left femur, initial encounter for closed fracture: Secondary | ICD-10-CM | POA: Diagnosis present

## 2018-09-11 DIAGNOSIS — R509 Fever, unspecified: Secondary | ICD-10-CM

## 2018-09-11 DIAGNOSIS — M1990 Unspecified osteoarthritis, unspecified site: Secondary | ICD-10-CM | POA: Diagnosis present

## 2018-09-11 DIAGNOSIS — Z87891 Personal history of nicotine dependence: Secondary | ICD-10-CM

## 2018-09-11 DIAGNOSIS — Z961 Presence of intraocular lens: Secondary | ICD-10-CM | POA: Diagnosis present

## 2018-09-11 DIAGNOSIS — I480 Paroxysmal atrial fibrillation: Secondary | ICD-10-CM

## 2018-09-11 DIAGNOSIS — Z9842 Cataract extraction status, left eye: Secondary | ICD-10-CM | POA: Diagnosis not present

## 2018-09-11 DIAGNOSIS — Z7902 Long term (current) use of antithrombotics/antiplatelets: Secondary | ICD-10-CM | POA: Diagnosis not present

## 2018-09-11 DIAGNOSIS — Z20828 Contact with and (suspected) exposure to other viral communicable diseases: Secondary | ICD-10-CM | POA: Diagnosis present

## 2018-09-11 DIAGNOSIS — S72002D Fracture of unspecified part of neck of left femur, subsequent encounter for closed fracture with routine healing: Secondary | ICD-10-CM | POA: Diagnosis not present

## 2018-09-11 DIAGNOSIS — M25552 Pain in left hip: Secondary | ICD-10-CM | POA: Diagnosis present

## 2018-09-11 DIAGNOSIS — I4891 Unspecified atrial fibrillation: Secondary | ICD-10-CM | POA: Diagnosis not present

## 2018-09-11 LAB — CBC WITH DIFFERENTIAL/PLATELET
Abs Immature Granulocytes: 0.03 10*3/uL (ref 0.00–0.07)
Basophils Absolute: 0 10*3/uL (ref 0.0–0.1)
Basophils Relative: 0 %
Eosinophils Absolute: 0.1 10*3/uL (ref 0.0–0.5)
Eosinophils Relative: 1 %
HCT: 36 % — ABNORMAL LOW (ref 39.0–52.0)
Hemoglobin: 12.1 g/dL — ABNORMAL LOW (ref 13.0–17.0)
Immature Granulocytes: 0 %
Lymphocytes Relative: 10 %
Lymphs Abs: 1 10*3/uL (ref 0.7–4.0)
MCH: 33.7 pg (ref 26.0–34.0)
MCHC: 33.6 g/dL (ref 30.0–36.0)
MCV: 100.3 fL — ABNORMAL HIGH (ref 80.0–100.0)
Monocytes Absolute: 0.5 10*3/uL (ref 0.1–1.0)
Monocytes Relative: 5 %
Neutro Abs: 7.8 10*3/uL — ABNORMAL HIGH (ref 1.7–7.7)
Neutrophils Relative %: 84 %
Platelets: 126 10*3/uL — ABNORMAL LOW (ref 150–400)
RBC: 3.59 MIL/uL — ABNORMAL LOW (ref 4.22–5.81)
RDW: 13.1 % (ref 11.5–15.5)
WBC: 9.3 10*3/uL (ref 4.0–10.5)
nRBC: 0 % (ref 0.0–0.2)

## 2018-09-11 LAB — BASIC METABOLIC PANEL
Anion gap: 6 (ref 5–15)
BUN: 21 mg/dL (ref 8–23)
CO2: 22 mmol/L (ref 22–32)
Calcium: 8.9 mg/dL (ref 8.9–10.3)
Chloride: 111 mmol/L (ref 98–111)
Creatinine, Ser: 1.17 mg/dL (ref 0.61–1.24)
GFR calc Af Amer: >60 mL/min (ref >60)
GFR calc non Af Amer: 57 mL/min — ABNORMAL LOW (ref >60)
Glucose, Bld: 112 mg/dL — ABNORMAL HIGH (ref 70–99)
Potassium: 4.1 mmol/L (ref 3.5–5.1)
Sodium: 139 mmol/L (ref 135–145)

## 2018-09-11 LAB — ABO/RH: ABO/RH(D): O NEG

## 2018-09-11 LAB — GLUCOSE, CAPILLARY: Glucose-Capillary: 135 mg/dL — ABNORMAL HIGH (ref 70–99)

## 2018-09-11 LAB — PREPARE RBC (CROSSMATCH)

## 2018-09-11 LAB — MAGNESIUM: Magnesium: 2.1 mg/dL (ref 1.7–2.4)

## 2018-09-11 LAB — SARS CORONAVIRUS 2 BY RT PCR (HOSPITAL ORDER, PERFORMED IN ~~LOC~~ HOSPITAL LAB): SARS Coronavirus 2: NEGATIVE

## 2018-09-11 MED ORDER — ACETAMINOPHEN 650 MG RE SUPP: 650.0000 mg | Freq: Four times a day (QID) | RECTAL | Status: DC | PRN

## 2018-09-11 MED ORDER — MUPIROCIN 2 % EX OINT
1.0000 "application " | TOPICAL_OINTMENT | Freq: Two times a day (BID) | CUTANEOUS | Status: AC
  Administered 2018-09-12 – 2018-09-16 (×9): 1 via NASAL
  Filled 2018-09-11: qty 22

## 2018-09-11 MED ORDER — HYDROMORPHONE HCL 1 MG/ML IJ SOLN
1.0000 mg | Freq: Once | INTRAMUSCULAR | Status: AC
  Administered 2018-09-11: 1 mg via INTRAVENOUS
  Filled 2018-09-11: qty 1

## 2018-09-11 MED ORDER — MORPHINE SULFATE (PF) 2 MG/ML IV SOLN: 2.0000 mg | INTRAVENOUS | Status: DC | PRN

## 2018-09-11 MED ORDER — CEFAZOLIN SODIUM-DEXTROSE 2-4 GM/100ML-% IV SOLN
2.0000 g | Freq: Once | INTRAVENOUS | Status: AC
  Administered 2018-09-12: 11:00:00 2 g via INTRAVENOUS
  Filled 2018-09-11: qty 100

## 2018-09-11 MED ORDER — SODIUM CHLORIDE 0.9 % IV SOLN
INTRAVENOUS | Status: DC
  Administered 2018-09-11: 75 mL/h via INTRAVENOUS
  Administered 2018-09-12: 08:00:00 via INTRAVENOUS

## 2018-09-11 MED ORDER — SODIUM CHLORIDE 0.9% IV SOLUTION: Freq: Once | INTRAVENOUS | Status: DC

## 2018-09-11 MED ORDER — INSULIN ASPART 100 UNIT/ML ~~LOC~~ SOLN
0.0000 [IU] | Freq: Four times a day (QID) | SUBCUTANEOUS | Status: DC
  Administered 2018-09-11 – 2018-09-12 (×2): 1 [IU] via SUBCUTANEOUS

## 2018-09-11 MED ORDER — SODIUM CHLORIDE 0.9 % IV SOLN
INTRAVENOUS | Status: DC
  Administered 2018-09-11: 17:00:00 via INTRAVENOUS

## 2018-09-11 MED ORDER — POVIDONE-IODINE 10 % EX SWAB: 2.0000 "application " | Freq: Once | CUTANEOUS | Status: DC

## 2018-09-11 MED ORDER — ACETAMINOPHEN 325 MG PO TABS
650.0000 mg | ORAL_TABLET | Freq: Four times a day (QID) | ORAL | Status: DC | PRN
  Administered 2018-09-18: 650 mg via ORAL
  Filled 2018-09-11: qty 2

## 2018-09-11 MED ORDER — ONDANSETRON HCL 4 MG PO TABS: 4.0000 mg | ORAL_TABLET | Freq: Four times a day (QID) | ORAL | Status: DC | PRN

## 2018-09-11 MED ORDER — CHLORHEXIDINE GLUCONATE 4 % EX LIQD
60.0000 mL | Freq: Once | CUTANEOUS | Status: AC
  Administered 2018-09-12: 4 via TOPICAL
  Filled 2018-09-11: qty 60

## 2018-09-11 MED ORDER — POLYETHYLENE GLYCOL 3350 17 G PO PACK: 17.0000 g | PACK | Freq: Every day | ORAL | Status: DC | PRN

## 2018-09-11 MED ORDER — ONDANSETRON HCL 4 MG/2ML IJ SOLN: 4.0000 mg | Freq: Four times a day (QID) | INTRAMUSCULAR | Status: DC | PRN

## 2018-09-11 NOTE — ED Notes (Signed)
Patient transported to X-ray 

## 2018-09-11 NOTE — H&P (Signed)
History and Physical    Shannon CaulJames R Wronski Sr. ZOX:[id number] DOB: [birth year] DOA: 09/11/2018  PCP: Joette CatchingNyland, Leonard, MD   Patient coming from: Home  I have personally briefly [facility]  Chief Complaint: Fall  HPI: Shannon CaulJames R Reesman Sr. is a 85 y.o. male with medical history significant for remote stroke, hypertension, diabetes mellitus, who was brought to the ED after patient fell today.  Patient appears slightly confused-but per care everywhere on his visits, he is sometimes confused, but he is able to answer a few questions appropriately. Patient tells me he was trying to get out of his friend's car, when he fell.  He says he might have slipped.  He tells me he hit his head a bit.  He denies any chest pain or difficulty breathing.  Denies dizziness, cough, fever or chills, vomiting or loose stools.  ED Course: Blood pressure systolic 101-106, heart rate 70s to 80s.  Slight drop in hemoglobin 12.1 from 13.52 years ago otherwise unremarkable CBC, BMP.  EKG shows sinus tachycardia,.  Portable chest x-ray without acute abnormality.  Pelvic x-ray shows comminuted intertrochanteric fracture of the proximal left femur.  Hospitalist to admit for hip fracture.  Review of Systems: As per HPI all other systems reviewed and negative.  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 His smoking use included cigars. He quit after 5.00 years of use. He has never used smokeless tobacco. He reports that he does not drink alcohol or use drugs.  No Known Allergies  Family history of hypertension.  Prior to Admission medications   Medication Sig Start Date End Date Taking? Authorizing Provider  dipyridamole-aspirin (AGGRENOX) 200-25 MG per 12 hr capsule Take 1 capsule by mouth 2 (two) times daily. Morning & supper   Yes [provider]  Folic Acid-Vit B6-Vit B12 (FOLBEE PO) Take 1 tablet by mouth daily.   Yes [provider]  glipiZIDE (GLUCOTROL XL) 2.5 MG 24 hr tablet Take 2.5 mg by mouth daily with breakfast.   Yes [provider]  lisinopril (ZESTRIL) 10 MG tablet TAKE ONE TABLET (10 MG DOSE) BY MOUTH DAILY. 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  Mucous membranes are moist. Posterior pharynx clear of any exudate or lesions. Neck: normal, supple, no masses, no thyromegaly Respiratory: clear to auscultation bilaterally, no wheezing, no crackles. Normal respiratory effort. No accessory muscle use.  Cardiovascular: Regular rate and rhythm, no murmurs / rubs  / gallops. No extremity edema. 2+ pedal pulses. Abdomen: no tenderness, no masses palpated. No hepatosplenomegaly. Bowel sounds positive.  Musculoskeletal: no clubbing / cyanosis. No joint deformity upper and lower extremities. Good ROM, no contractures. Normal muscle tone.  Skin: no rashes, lesions, ulcers. No induration Neurologic: CN 2-12 grossly intact.  Strength 5/5 in all extremities except left lower extremity not tested. Psychiatric: Normal judgment and insight. Alert and oriented x 3. Normal mood.   Labs on Admission: I have personally reviewed following labs and imaging studies  CBC: Recent Labs  Lab 09/11/18 1607  WBC 9.3  NEUTROABS 7.8*  HGB 12.1*  HCT 36.0*  MCV 100.3*  PLT 242*   Basic Metabolic Panel: Recent Labs  Lab 09/11/18 1607  NA 139  K 4.1  CL 111  CO2 22  GLUCOSE 112*  BUN 21  CREATININE 1.17  CALCIUM 8.9    Radiological Exams on Admission: Dg Chest 1 View  Result Date: 09/11/2018 CLINICAL DATA:  Pre operative respiratory exam.  Left hip fracture. EXAM: CHEST  1 VIEW COMPARISON:  None. FINDINGS: The heart size and mediastinal contours are within normal limits. Both lungs are clear. The visualized skeletal structures are unremarkable. IMPRESSION: No active disease. Electronically Signed   By: Lorriane Shire M.D.   On: 09/11/2018 16:43   Dg Hip Unilat W Or Wo Pelvis 2-3 Views Left  Result Date: 09/11/2018 CLINICAL DATA:  Left hip pain secondary to a fall today. EXAM: DG HIP (WITH OR WITHOUT PELVIS) 2-3V LEFT COMPARISON:  None. FINDINGS: There is a comminuted intertrochanteric fracture proximal left femur with slight distraction. Diffuse osteopenia. Superolateral joint space narrowing at the left hip. Pelvic bones are intact. IMPRESSION: Comminuted intertrochanteric fracture of the proximal left femur. Electronically Signed   By: Lorriane Shire M.D.   On: 09/11/2018 16:42    EKG: Independently reviewed.  Sinus tachycardia, prolonged QTC 504.  No  significant changes from prior.  Assessment/Plan Active Problems:   Closed left hip fracture (HCC)  Left femur fracture-sustained from mechanical fall.  Patient denies cardiorespiratory or neurologic symptoms.  Pelvic x-ray shows a comminuted third intertrochanteric fracture involving the proximal left femur. -Talked to Ortho, Dr. Aline Brochure, he will see patient. -N.p.o. midnight -IV morphine 2 mg PRN -Patient tells me he hit his head, obtain stat head CT - N/s 75cc/hr x 12hrs  Prolonged QTC-prolonged at 504.  Potassium 4.1. -Monitor on telemetry, check magnesium  Hypertension- systolic 683-419. -Home lisinopril -Hydrate.  Diabetes mellitus-random glucose 112 - SSI -Hold home glipizide  Stroke-remote history. -Hold home Aggrenox.  DVT prophylaxis: SCDS Code Status: Full Family Communication: None at bedside Disposition Plan: Per rounding team Consults called: Orthopedics Admission status: Inpt, tele I certify that at the point of admission it is my clinical judgment that the patient will require inpatient hospital care spanning beyond 2 midnights from  the point of admission due to high intensity of service, high risk for further deterioration and high frequency of surveillance required. The following factors support the patient status of inpatient: Left femur fracture requiring surgery.   Onnie BoerEjiroghene E Dawnyel Leven MD [facility]  09/11/2018, 6:42 PM

## 2018-09-11 NOTE — Consult Note (Signed)
Shannon Cooke    Patient ID: Shannon Cooke., male   DOB: 1933/09/03, 85 y.o.   MRN: [id number]  New patient  Requested by:  Reason for:   Based on the information below I recommend open treatment internal fixation with dynamic hip screw  Chief Complaint  Patient presents with  .  Painful left hip     Tyjai Cooke. is a 85 y.o. male.    HPI  85 year old male uses a walker at home lives alone was getting out of a car without his walker fell noted acute pain in his left hip and then he could not walk  Duration injury date was July 27  Severity 10 out of 10  Pain quality dull ache  Modified by movement with worsening pain   Review of Systems  Respiratory: Negative for shortness of breath.   Cardiovascular: Negative for chest pain.  Musculoskeletal: Positive for falls, joint pain and myalgias.  Psychiatric/Behavioral: Positive for memory loss.  All other systems reviewed and are negative.   Past Medical History:  Diagnosis Date  . Arthritis   . Diabetes mellitus    diet controlled  . Hypertension   . Stroke [facility])    no deficits    Past Surgical History:  Procedure Laterality Date  . 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 CATARACT EXTRACTION W/PHACO  09/27/2011   Procedure: CATARACT EXTRACTION PHACO AND INTRAOCULAR LENS PLACEMENT (IOC);  Surgeon: Tonny Branch, MD;  Location: [facility];  Service: Ophthalmology;  Laterality: Right;  CDE:82.67  . CATARACT EXTRACTION W/PHACO Left 07/26/2016   Procedure: CATARACT EXTRACTION PHACO AND INTRAOCULAR LENS PLACEMENT (IOC);  Surgeon: Tonny Branch, MD;  Location: [facility];  Service: Ophthalmology;  Laterality: Left;  CDE: 52.55  . CHOLECYSTECTOMY     [facility]  . KNEE ARTHROSCOPY     bilateral    History reviewed. No pertinent family history.  Family history noncontributory to present illness Social History Social History   Tobacco Use  . Smoking status: Former Smoker     Years: 5.00    Types: Cigars    Quit date: 09/19/1988    Years since quitting: 29.9  . Smokeless tobacco: Never Used  Substance Use Topics  . Alcohol use: No  . Drug use: No     No Known Allergies  Current Facility-Administered Medications  Medication Dose Route Frequency Provider Last Rate Last Dose  . 0.9 %  sodium chloride infusion   Intravenous Continuous Virgel Manifold, MD 100 mL/hr at 09/11/18 1659     Current Outpatient Medications  Medication Sig Dispense Refill  . dipyridamole-aspirin (AGGRENOX) 200-25 MG per 12 hr capsule Take 1 capsule by mouth 2 (two) times daily. Morning & supper    . Folic Acid-Vit Y8-MVH Q46 (FOLBEE PO) Take 1 tablet by mouth daily.    Marland Kitchen glipiZIDE (GLUCOTROL XL) 2.5 MG 24 hr tablet Take 2.5 mg by mouth daily with breakfast.    . lisinopril (ZESTRIL) 10 MG tablet TAKE ONE TABLET (10 MG DOSE) BY MOUTH DAILY.       Physical Exam(=30) BP 106/66   Pulse 72   Ht 6\' 1"  (1.854 m)   Wt 77.1 kg   SpO2 97%   BMI 22.43 kg/m   Gen. Appearance he appears frail to me thin without any muscle wasting Peripheral vascular system no major deficits no edema Lymph nodes groin area normal Gait unable to walk secondary to  hip fracture  Left Upper extremity  Inspection revealed no malalignment or asymmetry  Assessment of range of motion: Full range of motion was recorded  Assessment of stability: Elbow wrist and hand and shoulder were stable  Assessment of muscle strength and tone revealed grade 5 muscle strength and normal muscle tone  Skin was normal without rash lesion or ulceration  Right upper extremity  Inspection revealed no malalignment or asymmetry  Assessment of range of motion: Full range of motion was recorded  Assessment of stability: Elbow wrist and hand and shoulder were stable  Assessment of muscle strength and tone revealed grade 5 muscle strength and normal muscle tone  Skin was normal without rash lesion or ulceration  Right Lower  extremity  Inspection revealed no malalignment or asymmetry  Assessment of range of motion: Full range of motion was recorded  Assessment of stability: Ankle, knee and hip were stable  Assessment of muscle strength and tone revealed grade 5 muscle strength and normal muscle tone  Skin was normal without rash lesion or ulceration  Left lower extremity T proximal thigh R no range of motion in the hip secondary to fracture I stability of hip could not be assessed because of fracture M normal muscle tone no tremor Skin was normal without rash lesion or ulceration  Coordination was tested by finger-to-nose nose and was normal Deep tendon reflexes were 2+ in the upper extremities  Examination of sensation by touch was normal  Mental status  Oriented to time person and place normal  Mood and affect normal without depression anxiety or agitation  Dx: Left hip fracture intertrochanteric  Data Reviewed  ER RECORD REVIEWED: CONFIRMS HISTORY   I reviewed the following images and the reports and my independent interpretation is left intertrochanteric fracture minimal displacement   Assessment/Plan  85-year-old male ambulates with a walker sometimes in the community lives alone fractured his left hip.  Recommend internal fixation with dynamic hip screw  The procedure has been fully reviewed with the patient; The risks and benefits of surgery have been discussed and explained and understood. Alternative treatment has also been reviewed, questions were encouraged and answered. The postoperative plan is also been reviewed.      E  MD  

## 2018-09-11 NOTE — Progress Notes (Signed)
CHB bath completed.  MRSA swab completed.

## 2018-09-11 NOTE — ED Triage Notes (Signed)
Pt fell today trying to get into his friends car.  Injury to his left wrist and left hip.  Slight rotation and shortening to his left hip and pain with movement.  Skin tear to his left wrist.

## 2018-09-11 NOTE — Plan of Care (Signed)
  Problem: Nutrition: Goal: Adequate nutrition will be maintained Outcome: Progressing   Problem: Clinical Measurements: Goal: Ability to maintain clinical measurements within normal limits will improve Outcome: Progressing   Problem: Pain Managment: Goal: General experience of comfort will improve Outcome: Progressing

## 2018-09-11 NOTE — ED Provider Notes (Signed)
[facility] EMERGENCY DEPARTMENT Provider Note   CSN: [id number] Arrival date & time: 09/11/18  1521     History   Chief Complaint Chief Complaint  Patient presents with  . Hip Pain    HPI Shannon Cooke. is a 85 y.o. male.     HPI   85yM with L hip pain. Mechanical fall earlier today and severe/persistent L hip pain since. Denies any other injuries. Not anticoagulated. No numbness or tingling.   Past Medical History:  Diagnosis Date  . Arthritis   . Diabetes mellitus    diet controlled  . Hypertension   . Stroke [facility])    no deficits    There are no active problems to display for this patient.   Past Surgical History:  Procedure Laterality Date  . 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 CATARACT EXTRACTION W/PHACO  09/27/2011   Procedure: CATARACT EXTRACTION PHACO AND INTRAOCULAR LENS PLACEMENT (IOC);  Surgeon: Tonny Branch, MD;  Location: [facility];  Service: Ophthalmology;  Laterality: Right;  CDE:82.67  . CATARACT EXTRACTION W/PHACO Left 07/26/2016   Procedure: CATARACT EXTRACTION PHACO AND INTRAOCULAR LENS PLACEMENT (IOC);  Surgeon: Tonny Branch, MD;  Location: [facility];  Service: Ophthalmology;  Laterality: Left;  CDE: 52.55  . CHOLECYSTECTOMY     [facility]  . KNEE ARTHROSCOPY     bilateral        Home Medications    Prior to Admission medications   Medication Sig Start Date End Date Taking? Authorizing Provider  dipyridamole-aspirin (AGGRENOX) 200-25 MG per 12 hr capsule Take 1 capsule by mouth 2 (two) times daily. Morning & supper    [provider]  Folic Acid-Vit X5-QMG Q67 (FOLBEE PO) Take 1 tablet by mouth daily.    [provider]  lisinopril (PRINIVIL,ZESTRIL) 5 MG tablet Take 5 mg by mouth daily.    [provider]  Omega-3 Fatty Acids (FISH OIL) 1000 MG CAPS Take 1,000 mg by mouth 2 (two) times daily. Morning & supper    [provider]    Family History History reviewed. No pertinent  family history.  Social History Social History   Tobacco Use  . Smoking status: Former Smoker    Years: 5.00    Types: Cigars    Quit date: 09/19/1988    Years since quitting: 29.9  . Smokeless tobacco: Never Used  Substance Use Topics  . Alcohol use: No  . Drug use: No     Allergies   Patient has no known allergies.   Review of Systems Review of Systems  All systems reviewed and negative, other than as noted in HPI.  Physical Exam Updated Vital Signs Ht 6\' 1"  (1.854 m)   Wt 77.1 kg   BMI 22.43 kg/m   Physical Exam Vitals signs and nursing note reviewed.  Constitutional:      General: He is not in acute distress.    Appearance: He is well-developed.  HENT:     Head: Normocephalic and atraumatic.  Eyes:     General:        Right eye: No discharge.        Left eye: No discharge.     Conjunctiva/sclera: Conjunctivae normal.  Neck:     Musculoskeletal: Neck supple.  Cardiovascular:     Rate and Rhythm: Normal rate and regular rhythm.     Heart sounds: Normal heart sounds. No murmur. No friction rub. No gallop.  Pulmonary:     Effort: Pulmonary effort is normal. No respiratory distress.     Breath sounds: Normal breath sounds.  Abdominal:     General: There is no distension.     Palpations: Abdomen is soft.     Tenderness: There is no abdominal tenderness.  Musculoskeletal:        General: No tenderness.     Comments: LLE shortened and externally rotated. Cannot range 2/2 severe pain. NVI. No midline spinal tenderness.   Skin:    General: Skin is warm and dry.  Neurological:     Mental Status: He is alert.  Psychiatric:        Behavior: Behavior normal.        Thought Content: Thought content normal.      ED Treatments / Results  Labs (all labs ordered are listed, but only abnormal results are displayed) Labs Reviewed  CBC WITH DIFFERENTIAL/PLATELET - Abnormal; Notable for the following components:      Result Value   RBC 3.59 (*)    Hemoglobin  12.1 (*)    HCT 36.0 (*)    MCV 100.3 (*)    Platelets 126 (*)    Neutro Abs 7.8 (*)    All other components within normal limits  BASIC METABOLIC PANEL - Abnormal; Notable for the following components:   Glucose, Bld 112 (*)    GFR calc non Af Amer 57 (*)    All other components within normal limits  SARS CORONAVIRUS 2 (HOSPITAL ORDER, PERFORMED IN [facility])    EKG EKG Interpretation  Date/Time:  Monday September 11 2018 17:04:45 EDT Ventricular Rate:  106 PR Interval:    QRS Duration: 105 QT Interval:  379 QTC Calculation: 504 R Axis:   85 Text Interpretation:  Sinus tachycardia with irregular rate First degree A-V block Probable left atrial enlargement Borderline right axis deviation Prolonged QT interval Confirmed by Paula LibraMolpus, John ([id number]) on 09/12/2018 12:04:03 PM   Radiology Dg Chest 1 View  Result Date: 09/11/2018 CLINICAL DATA:  Pre operative respiratory exam.  Left hip fracture. EXAM: CHEST  1 VIEW COMPARISON:  None. FINDINGS: The heart size and mediastinal contours are within normal limits. Both lungs are clear. The visualized skeletal structures are unremarkable. IMPRESSION: No active disease. Electronically Signed   By: Francene BoyersJames  Maxwell M.D.   On: 09/11/2018 16:43   Dg Hip Unilat W Or Wo Pelvis 2-3 Views Left  Result Date: 09/11/2018 CLINICAL DATA:  Left hip pain secondary to a fall today. EXAM: DG HIP (WITH OR WITHOUT PELVIS) 2-3V LEFT COMPARISON:  None. FINDINGS: There is a comminuted intertrochanteric fracture proximal left femur with slight distraction. Diffuse osteopenia. Superolateral joint space narrowing at the left hip. Pelvic bones are intact. IMPRESSION: Comminuted intertrochanteric fracture of the proximal left femur. Electronically Signed   By: Francene BoyersJames  Maxwell M.D.   On: 09/11/2018 16:42    Procedures Procedures (including critical care time)  Medications Ordered in ED Medications  0.9 %  sodium chloride infusion (has no administration in time  range)  HYDROmorphone (DILAUDID) injection 1 mg (has no administration in time range)     Initial Impression / Assessment and Plan / ED Course  I have reviewed the triage vital signs and the nursing notes.  Pertinent labs & imaging results that were available during my care of the patient were reviewed by me and considered in my medical decision making (see chart for details).        85yM with  L hip pain. Mechanical fall with resultant fracture. Ortho consulted. Medicine admission.   Final Clinical Impressions(s) / ED Diagnoses   Final diagnoses:  Closed left hip fracture [facility](HCC)  S/p left hip fracture    ED Discharge Orders    None       Raeford RazorKohut, Azion Centrella, MD 09/13/18 [phone]

## 2018-09-11 NOTE — H&P (View-Only) (Signed)
[place]    Patient ID: Shannon Steil., male   DOB: 1933/09/03, 85 y.o.   MRN: [id number]  New patient  Requested by:  Reason for:   Based on the information below I recommend open treatment internal fixation with dynamic hip screw  Chief Complaint  Patient presents with  .  Painful left hip     Shannon Matuszak. is a 85 y.o. male.    HPI  85 year old male uses a walker at home lives alone was getting out of a car without his walker fell noted acute pain in his left hip and then he could not walk  Duration injury date was July 27  Severity 10 out of 10  Pain quality dull ache  Modified by movement with worsening pain   Review of Systems  Respiratory: Negative for shortness of breath.   Cardiovascular: Negative for chest pain.  Musculoskeletal: Positive for falls, joint pain and myalgias.  Psychiatric/Behavioral: Positive for memory loss.  All other systems reviewed and are negative.   Past Medical History:  Diagnosis Date  . Arthritis   . Diabetes mellitus    diet controlled  . Hypertension   . Stroke [facility])    no deficits    Past Surgical History:  Procedure Laterality Date  . 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 CATARACT EXTRACTION W/PHACO  09/27/2011   Procedure: CATARACT EXTRACTION PHACO AND INTRAOCULAR LENS PLACEMENT (IOC);  Surgeon: Tonny Branch, MD;  Location: [facility];  Service: Ophthalmology;  Laterality: Right;  CDE:82.67  . CATARACT EXTRACTION W/PHACO Left 07/26/2016   Procedure: CATARACT EXTRACTION PHACO AND INTRAOCULAR LENS PLACEMENT (IOC);  Surgeon: Tonny Branch, MD;  Location: [facility];  Service: Ophthalmology;  Laterality: Left;  CDE: 52.55  . CHOLECYSTECTOMY     [facility]  . KNEE ARTHROSCOPY     bilateral    History reviewed. No pertinent family history.  Family history noncontributory to present illness Social History Social History   Tobacco Use  . Smoking status: Former Smoker     Years: 5.00    Types: Cigars    Quit date: 09/19/1988    Years since quitting: 29.9  . Smokeless tobacco: Never Used  Substance Use Topics  . Alcohol use: No  . Drug use: No     No Known Allergies  Current Facility-Administered Medications  Medication Dose Route Frequency Provider Last Rate Last Dose  . 0.9 %  sodium chloride infusion   Intravenous Continuous Virgel Manifold, MD 100 mL/hr at 09/11/18 1659     Current Outpatient Medications  Medication Sig Dispense Refill  . dipyridamole-aspirin (AGGRENOX) 200-25 MG per 12 hr capsule Take 1 capsule by mouth 2 (two) times daily. Morning & supper    . Folic Acid-Vit Y8-MVH Q46 (FOLBEE PO) Take 1 tablet by mouth daily.    Marland Kitchen glipiZIDE (GLUCOTROL XL) 2.5 MG 24 hr tablet Take 2.5 mg by mouth daily with breakfast.    . lisinopril (ZESTRIL) 10 MG tablet TAKE ONE TABLET (10 MG DOSE) BY MOUTH DAILY.       Physical Exam(=30) BP 106/66   Pulse 72   Ht 6\' 1"  (1.854 m)   Wt 77.1 kg   SpO2 97%   BMI 22.43 kg/m   Gen. Appearance he appears frail to me thin without any muscle wasting Peripheral vascular system no major deficits no edema Lymph nodes groin area normal Gait unable to walk secondary to  hip fracture  Left Upper extremity  Inspection revealed no malalignment or asymmetry  Assessment of range of motion: Full range of motion was recorded  Assessment of stability: Elbow wrist and hand and shoulder were stable  Assessment of muscle strength and tone revealed grade 5 muscle strength and normal muscle tone  Skin was normal without rash lesion or ulceration  Right upper extremity  Inspection revealed no malalignment or asymmetry  Assessment of range of motion: Full range of motion was recorded  Assessment of stability: Elbow wrist and hand and shoulder were stable  Assessment of muscle strength and tone revealed grade 5 muscle strength and normal muscle tone  Skin was normal without rash lesion or ulceration  Right Lower  extremity  Inspection revealed no malalignment or asymmetry  Assessment of range of motion: Full range of motion was recorded  Assessment of stability: Ankle, knee and hip were stable  Assessment of muscle strength and tone revealed grade 5 muscle strength and normal muscle tone  Skin was normal without rash lesion or ulceration  Left lower extremity T proximal thigh R no range of motion in the hip secondary to fracture I stability of hip could not be assessed because of fracture M normal muscle tone no tremor Skin was normal without rash lesion or ulceration  Coordination was tested by finger-to-nose nose and was normal Deep tendon reflexes were 2+ in the upper extremities  Examination of sensation by touch was normal  Mental status  Oriented to time person and place normal  Mood and affect normal without depression anxiety or agitation  Dx: Left hip fracture intertrochanteric  Data Reviewed  ER RECORD REVIEWED: CONFIRMS HISTORY   I reviewed the following images and the reports and my independent interpretation is left intertrochanteric fracture minimal displacement   Assessment/Plan  85 year old male ambulates with a walker sometimes in the community lives alone fractured his left hip.  Recommend internal fixation with dynamic hip screw  The procedure has been fully reviewed with the patient; The risks and benefits of surgery have been discussed and explained and understood. Alternative treatment has also been reviewed, questions were encouraged and answered. The postoperative plan is also been reviewed.     Vickki HearingStanley E Laresa Oshiro MD

## 2018-09-12 ENCOUNTER — Inpatient Hospital Stay (HOSPITAL_COMMUNITY): Payer: Medicare Other

## 2018-09-12 ENCOUNTER — Encounter (HOSPITAL_COMMUNITY): Payer: Self-pay | Admitting: Anesthesiology

## 2018-09-12 ENCOUNTER — Inpatient Hospital Stay (HOSPITAL_COMMUNITY): Payer: Medicare Other | Admitting: Anesthesiology

## 2018-09-12 ENCOUNTER — Encounter (HOSPITAL_COMMUNITY)
Admission: EM | Disposition: A | Discharge status: Skilled Nursing Facility | Payer: Self-pay | Source: Home / Self Care | Attending: Internal Medicine

## 2018-09-12 DIAGNOSIS — S72142A Displaced intertrochanteric fracture of left femur, initial encounter for closed fracture: Principal | ICD-10-CM

## 2018-09-12 DIAGNOSIS — I10 Essential (primary) hypertension: Secondary | ICD-10-CM

## 2018-09-12 DIAGNOSIS — S72141A Displaced intertrochanteric fracture of right femur, initial encounter for closed fracture: Secondary | ICD-10-CM

## 2018-09-12 DIAGNOSIS — I4892 Unspecified atrial flutter: Secondary | ICD-10-CM

## 2018-09-12 DIAGNOSIS — I639 Cerebral infarction, unspecified: Secondary | ICD-10-CM | POA: Diagnosis present

## 2018-09-12 HISTORY — PX: COMPRESSION HIP SCREW: SHX1386

## 2018-09-12 LAB — BASIC METABOLIC PANEL
Anion gap: 11 (ref 5–15)
BUN: 26 mg/dL — ABNORMAL HIGH (ref 8–23)
CO2: 19 mmol/L — ABNORMAL LOW (ref 22–32)
Calcium: 8.3 mg/dL — ABNORMAL LOW (ref 8.9–10.3)
Chloride: 110 mmol/L (ref 98–111)
Creatinine, Ser: 1.05 mg/dL (ref 0.61–1.24)
GFR calc Af Amer: >60 mL/min (ref >60)
GFR calc non Af Amer: >60 mL/min (ref >60)
Glucose, Bld: 131 mg/dL — ABNORMAL HIGH (ref 70–99)
Potassium: 4.2 mmol/L (ref 3.5–5.1)
Sodium: 140 mmol/L (ref 135–145)

## 2018-09-12 LAB — CBC
HCT: 33.1 % — ABNORMAL LOW (ref 39.0–52.0)
Hemoglobin: 10.9 g/dL — ABNORMAL LOW (ref 13.0–17.0)
MCH: 33.3 pg (ref 26.0–34.0)
MCHC: 32.9 g/dL (ref 30.0–36.0)
MCV: 101.2 fL — ABNORMAL HIGH (ref 80.0–100.0)
Platelets: 108 10*3/uL — ABNORMAL LOW (ref 150–400)
RBC: 3.27 MIL/uL — ABNORMAL LOW (ref 4.22–5.81)
RDW: 13 % (ref 11.5–15.5)
WBC: 10.4 10*3/uL (ref 4.0–10.5)
nRBC: 0 % (ref 0.0–0.2)

## 2018-09-12 LAB — SURGICAL PCR SCREEN
MRSA, PCR: NEGATIVE
Staphylococcus aureus: NEGATIVE

## 2018-09-12 LAB — TSH: TSH: 2.065 u[IU]/mL (ref 0.350–4.500)

## 2018-09-12 LAB — GLUCOSE, CAPILLARY
Glucose-Capillary: 130 mg/dL — ABNORMAL HIGH (ref 70–99)
Glucose-Capillary: 124 mg/dL — ABNORMAL HIGH (ref 70–99)
Glucose-Capillary: 142 mg/dL — ABNORMAL HIGH (ref 70–99)
Glucose-Capillary: 144 mg/dL — ABNORMAL HIGH (ref 70–99)

## 2018-09-12 SURGERY — COMPRESSION HIP
Anesthesia: General | Site: Hip | Laterality: Left

## 2018-09-12 MED ORDER — FOLIC ACID 1 MG PO TABS
1.0000 mg | ORAL_TABLET | Freq: Every day | ORAL | Status: DC
  Administered 2018-09-12 – 2018-09-20 (×9): 1 mg via ORAL
  Filled 2018-09-12 (×9): qty 1

## 2018-09-12 MED ORDER — SODIUM CHLORIDE 0.9 % IV SOLN
INTRAVENOUS | Status: DC
  Administered 2018-09-12: 17:00:00 via INTRAVENOUS
  Administered 2018-09-13: 75 mL/h via INTRAVENOUS
  Administered 2018-09-17 – 2018-09-19 (×6): via INTRAVENOUS

## 2018-09-12 MED ORDER — 0.9 % SODIUM CHLORIDE (POUR BTL) OPTIME
TOPICAL | Status: DC | PRN
  Administered 2018-09-12: 12:00:00 1000 mL

## 2018-09-12 MED ORDER — SUCCINYLCHOLINE CHLORIDE 20 MG/ML IJ SOLN
INTRAMUSCULAR | Status: DC | PRN
  Administered 2018-09-12: 120 mg via INTRAVENOUS

## 2018-09-12 MED ORDER — ROCURONIUM BROMIDE 50 MG/5ML IV SOSY
PREFILLED_SYRINGE | INTRAVENOUS | Status: DC | PRN
  Administered 2018-09-12: 20 mg via INTRAVENOUS
  Administered 2018-09-12: 10 mg via INTRAVENOUS

## 2018-09-12 MED ORDER — INSULIN ASPART 100 UNIT/ML ~~LOC~~ SOLN
0.0000 [IU] | Freq: Three times a day (TID) | SUBCUTANEOUS | Status: DC
  Administered 2018-09-13 (×3): 1 [IU] via SUBCUTANEOUS
  Administered 2018-09-16: 2 [IU] via SUBCUTANEOUS
  Administered 2018-09-16 (×2): 1 [IU] via SUBCUTANEOUS
  Administered 2018-09-17: 2 [IU] via SUBCUTANEOUS
  Administered 2018-09-18: 1 [IU] via SUBCUTANEOUS
  Administered 2018-09-19: 2 [IU] via SUBCUTANEOUS

## 2018-09-12 MED ORDER — HYDROMORPHONE HCL 1 MG/ML IJ SOLN: 0.2500 mg | INTRAMUSCULAR | Status: DC | PRN

## 2018-09-12 MED ORDER — HYDROCODONE-ACETAMINOPHEN 7.5-325 MG PO TABS: 1.0000 | ORAL_TABLET | Freq: Once | ORAL | Status: DC | PRN

## 2018-09-12 MED ORDER — METHOCARBAMOL 500 MG PO TABS: 500.0000 mg | ORAL_TABLET | Freq: Four times a day (QID) | ORAL | Status: DC | PRN

## 2018-09-12 MED ORDER — LACTATED RINGERS IV SOLN
INTRAVENOUS | Status: DC
  Administered 2018-09-12: 10:00:00 via INTRAVENOUS

## 2018-09-12 MED ORDER — FENTANYL CITRATE (PF) 100 MCG/2ML IJ SOLN
INTRAMUSCULAR | Status: AC
  Filled 2018-09-12: qty 2

## 2018-09-12 MED ORDER — ONDANSETRON HCL 4 MG/2ML IJ SOLN
INTRAMUSCULAR | Status: AC
  Filled 2018-09-12: qty 4

## 2018-09-12 MED ORDER — PHENYLEPHRINE 40 MCG/ML (10ML) SYRINGE FOR IV PUSH (FOR BLOOD PRESSURE SUPPORT)
PREFILLED_SYRINGE | INTRAVENOUS | Status: AC
  Filled 2018-09-12: qty 10

## 2018-09-12 MED ORDER — DIPHENHYDRAMINE HCL 12.5 MG/5ML PO ELIX: 12.5000 mg | ORAL_SOLUTION | ORAL | Status: DC | PRN

## 2018-09-12 MED ORDER — METOCLOPRAMIDE HCL 5 MG/ML IJ SOLN: 5.0000 mg | Freq: Three times a day (TID) | INTRAMUSCULAR | Status: DC | PRN

## 2018-09-12 MED ORDER — METOCLOPRAMIDE HCL 10 MG PO TABS: 5.0000 mg | ORAL_TABLET | Freq: Three times a day (TID) | ORAL | Status: DC | PRN

## 2018-09-12 MED ORDER — PROPOFOL 10 MG/ML IV BOLUS
INTRAVENOUS | Status: DC | PRN
  Administered 2018-09-12: 120 mg via INTRAVENOUS

## 2018-09-12 MED ORDER — PHENYLEPHRINE HCL (PRESSORS) 10 MG/ML IV SOLN
INTRAVENOUS | Status: DC | PRN
  Administered 2018-09-12: 80 ug via INTRAVENOUS

## 2018-09-12 MED ORDER — DOCUSATE SODIUM 100 MG PO CAPS
100.0000 mg | ORAL_CAPSULE | Freq: Two times a day (BID) | ORAL | Status: DC
  Administered 2018-09-12 – 2018-09-20 (×17): 100 mg via ORAL
  Filled 2018-09-12 (×17): qty 1

## 2018-09-12 MED ORDER — CEFAZOLIN SODIUM-DEXTROSE 2-4 GM/100ML-% IV SOLN
2.0000 g | Freq: Four times a day (QID) | INTRAVENOUS | Status: AC
  Administered 2018-09-12 – 2018-09-13 (×3): 2 g via INTRAVENOUS
  Filled 2018-09-12 (×3): qty 100

## 2018-09-12 MED ORDER — PROMETHAZINE HCL 25 MG/ML IJ SOLN: 6.2500 mg | INTRAMUSCULAR | Status: DC | PRN

## 2018-09-12 MED ORDER — BUPIVACAINE-EPINEPHRINE (PF) 0.5% -1:200000 IJ SOLN
INTRAMUSCULAR | Status: DC | PRN
  Administered 2018-09-12: 60 mL via PERINEURAL

## 2018-09-12 MED ORDER — SUCCINYLCHOLINE CHLORIDE 200 MG/10ML IV SOSY
PREFILLED_SYRINGE | INTRAVENOUS | Status: AC
  Filled 2018-09-12: qty 20

## 2018-09-12 MED ORDER — HYDROCODONE-ACETAMINOPHEN 5-325 MG PO TABS
1.0000 | ORAL_TABLET | ORAL | Status: DC | PRN
  Administered 2018-09-14 – 2018-09-16 (×2): 1 via ORAL
  Filled 2018-09-12 (×2): qty 1

## 2018-09-12 MED ORDER — GLIPIZIDE ER 2.5 MG PO TB24: 2.5000 mg | ORAL_TABLET | Freq: Every day | ORAL | Status: DC

## 2018-09-12 MED ORDER — DILTIAZEM HCL 30 MG PO TABS
30.0000 mg | ORAL_TABLET | Freq: Two times a day (BID) | ORAL | Status: DC
  Administered 2018-09-12: 30 mg via ORAL
  Filled 2018-09-12 (×2): qty 1

## 2018-09-12 MED ORDER — PROPOFOL 10 MG/ML IV BOLUS
INTRAVENOUS | Status: AC
  Filled 2018-09-12: qty 20

## 2018-09-12 MED ORDER — FENTANYL CITRATE (PF) 100 MCG/2ML IJ SOLN
INTRAMUSCULAR | Status: DC | PRN
  Administered 2018-09-12 (×2): 50 ug via INTRAVENOUS
  Administered 2018-09-12 (×2): 25 ug via INTRAVENOUS

## 2018-09-12 MED ORDER — MIDAZOLAM HCL 2 MG/2ML IJ SOLN: 0.5000 mg | Freq: Once | INTRAMUSCULAR | Status: DC | PRN

## 2018-09-12 MED ORDER — LISINOPRIL 5 MG PO TABS
5.0000 mg | ORAL_TABLET | Freq: Every day | ORAL | Status: DC
  Administered 2018-09-13: 5 mg via ORAL
  Filled 2018-09-12: qty 1

## 2018-09-12 MED ORDER — ONDANSETRON HCL 4 MG PO TABS: 4.0000 mg | ORAL_TABLET | Freq: Four times a day (QID) | ORAL | Status: DC | PRN

## 2018-09-12 MED ORDER — METHOCARBAMOL 1000 MG/10ML IJ SOLN: 500.0000 mg | Freq: Four times a day (QID) | INTRAVENOUS | Status: DC | PRN

## 2018-09-12 MED ORDER — ROCURONIUM BROMIDE 10 MG/ML (PF) SYRINGE
PREFILLED_SYRINGE | INTRAVENOUS | Status: AC
  Filled 2018-09-12: qty 20

## 2018-09-12 MED ORDER — TRAMADOL HCL 50 MG PO TABS
50.0000 mg | ORAL_TABLET | Freq: Four times a day (QID) | ORAL | Status: DC
  Administered 2018-09-12 – 2018-09-20 (×26): 50 mg via ORAL
  Filled 2018-09-12 (×28): qty 1

## 2018-09-12 MED ORDER — SUGAMMADEX SODIUM 200 MG/2ML IV SOLN
INTRAVENOUS | Status: DC | PRN
  Administered 2018-09-12: 150 mg via INTRAVENOUS

## 2018-09-12 MED ORDER — BUPIVACAINE-EPINEPHRINE (PF) 0.5% -1:200000 IJ SOLN
INTRAMUSCULAR | Status: AC
  Filled 2018-09-12: qty 60

## 2018-09-12 MED ORDER — ONDANSETRON HCL 4 MG/2ML IJ SOLN: 4.0000 mg | Freq: Four times a day (QID) | INTRAMUSCULAR | Status: DC | PRN

## 2018-09-12 MED ORDER — ASPIRIN-DIPYRIDAMOLE ER 25-200 MG PO CP12
1.0000 | ORAL_CAPSULE | Freq: Two times a day (BID) | ORAL | Status: DC
  Administered 2018-09-12: 1 via ORAL
  Filled 2018-09-12 (×6): qty 1

## 2018-09-12 MED ORDER — DEXAMETHASONE SODIUM PHOSPHATE 10 MG/ML IJ SOLN
INTRAMUSCULAR | Status: AC
  Filled 2018-09-12: qty 2

## 2018-09-12 MED ORDER — LISINOPRIL 10 MG PO TABS
10.0000 mg | ORAL_TABLET | Freq: Every day | ORAL | Status: DC
  Administered 2018-09-12: 10 mg via ORAL
  Filled 2018-09-12: qty 1

## 2018-09-12 SURGICAL SUPPLY — 59 items
APL PRP STRL LF DISP 70% ISPRP (MISCELLANEOUS) ×1
BIT DRILL TWIST 3.5MM (BIT) ×1 IMPLANT
BLADE SURG SZ10 CARB STEEL (BLADE) ×6 IMPLANT
BNDG GAUZE ELAST 4 BULKY (GAUZE/BANDAGES/DRESSINGS) ×3 IMPLANT
CHLORAPREP W/TINT 26 (MISCELLANEOUS) ×3 IMPLANT
CLOTH BEACON ORANGE TIMEOUT ST (SAFETY) ×3 IMPLANT
COVER LIGHT HANDLE STERIS (MISCELLANEOUS) ×6 IMPLANT
COVER MAYO STAND XLG (MISCELLANEOUS) ×3 IMPLANT
DECANTER SPIKE VIAL GLASS SM (MISCELLANEOUS) ×3 IMPLANT
DRAPE STERI IOBAN 125X83 (DRAPES) ×3 IMPLANT
DRILL OMEGA BONE (BIT) IMPLANT
DRILL TWIST 3.5MM (BIT) ×3
DRILL TWIST HELICAL 1/4IN (BIT) IMPLANT
DRSG MEPILEX BORDER 4X12 (GAUZE/BANDAGES/DRESSINGS) ×2 IMPLANT
DRSG MEPILEX BORDER 4X8 (GAUZE/BANDAGES/DRESSINGS) ×3 IMPLANT
ELECT REM PT RETURN 9FT ADLT (ELECTROSURGICAL) ×3
ELECTRODE REM PT RTRN 9FT ADLT (ELECTROSURGICAL) ×1 IMPLANT
GLOVE BIO SURGEON STRL SZ7 (GLOVE) ×4 IMPLANT
GLOVE BIOGEL PI IND STRL 7.0 (GLOVE) ×1 IMPLANT
GLOVE BIOGEL PI INDICATOR 7.0 (GLOVE) ×8
GLOVE ECLIPSE 6.5 STRL STRAW (GLOVE) ×2 IMPLANT
GLOVE SKINSENSE NS SZ8.0 LF (GLOVE) ×2
GLOVE SKINSENSE STRL SZ8.0 LF (GLOVE) ×1 IMPLANT
GLOVE SS N UNI LF 8.5 STRL (GLOVE) ×3 IMPLANT
GOWN STRL REUS W/TWL LRG LVL3 (GOWN DISPOSABLE) ×5 IMPLANT
GOWN STRL REUS W/TWL XL LVL3 (GOWN DISPOSABLE) ×3 IMPLANT
GUIDE PIN 3.2X230MM (Pin) ×3 IMPLANT
INST SET MAJOR BONE (KITS) ×3 IMPLANT
KIT BLADEGUARD II DBL (SET/KITS/TRAYS/PACK) ×3 IMPLANT
KIT TURNOVER CYSTO (KITS) ×3 IMPLANT
MANIFOLD NEPTUNE II (INSTRUMENTS) ×3 IMPLANT
MARKER SKIN DUAL TIP RULER LAB (MISCELLANEOUS) ×3 IMPLANT
NDL HYPO 21X1.5 SAFETY (NEEDLE) ×1 IMPLANT
NDL SPNL 18GX3.5 QUINCKE PK (NEEDLE) ×1 IMPLANT
NEEDLE HYPO 21X1.5 SAFETY (NEEDLE) ×3 IMPLANT
NEEDLE SPNL 18GX3.5 QUINCKE PK (NEEDLE) ×3 IMPLANT
NS IRRIG 1000ML POUR BTL (IV SOLUTION) ×3 IMPLANT
PACK BASIC III (CUSTOM PROCEDURE TRAY) ×3
PACK SRG BSC III STRL LF ECLPS (CUSTOM PROCEDURE TRAY) ×1 IMPLANT
PAD ABD 5X9 TENDERSORB (GAUZE/BANDAGES/DRESSINGS) ×3 IMPLANT
PAD ARMBOARD 7.5X6 YLW CONV (MISCELLANEOUS) ×3 IMPLANT
PENCIL HANDSWITCHING (ELECTRODE) ×3 IMPLANT
PIN GUIDE 3.2X230MM (Pin) ×1 IMPLANT
PLATE BONE CLASSIC 135 3H (Plate) ×2 IMPLANT
SCREW COMPRESSION 19.0M (Screw) ×2 IMPLANT
SCREW CORTICAL 48MM (Screw) ×2 IMPLANT
SCREW CORTICAL SFTP 4.5X40MM (Screw) ×2 IMPLANT
SCREW CORTICAL SFTP 4.5X42MM (Screw) ×2 IMPLANT
SCREW LAG 110MM (Screw) ×2 IMPLANT
SET BASIN LINEN APH (SET/KITS/TRAYS/PACK) ×3 IMPLANT
SPONGE LAP 18X18 RF (DISPOSABLE) ×6 IMPLANT
STAPLER VISISTAT 35W (STAPLE) ×3 IMPLANT
SUT BRALON NAB BRD #1 30IN (SUTURE) ×6 IMPLANT
SUT MNCRL 0 VIOLET CTX 36 (SUTURE) ×1 IMPLANT
SUT MONOCRYL 0 CTX 36 (SUTURE) ×2
SYR 30ML LL (SYRINGE) ×3 IMPLANT
SYR BULB IRRIGATION 50ML (SYRINGE) ×3 IMPLANT
TRAY FOLEY MTR SLVR 16FR STAT (SET/KITS/TRAYS/PACK) ×3 IMPLANT
YANKAUER SUCT 12FT TUBE ARGYLE (SUCTIONS) ×3 IMPLANT

## 2018-09-12 NOTE — Progress Notes (Addendum)
Called by RN that patient went into atrial flutter, new onset.  Patient seen and examined, denies chest pain or shortness of breath.  Cardiac monitor shows atrial flutter with heart rate 100 - 110.  Reviewed EKG which also shows atrial flutter. Patient has been admitted with left femur fracture status post fall.  He has history of CVA and is currently on Aggrenox.    Postoperative new onset atrial flutter Patient's  CHA2DS2VASc score is 5. We will have to weigh benefits versus risk for anticoagulation given recent fall with femur fracture.  Will defer the discussion for attending in a.m. Check TSH, echocardiogram in a.m. We will start diltiazem 30 mg p.o. every 12 hours.  Patient's heart rate is around 110, would avoid giving high-dose at this time. Also will cut down the dose of lisinopril from 10 mg to 5 mg p.o. daily, to avoid additive hypotension effect from Cardizem  Critical care time spent 35 minutes

## 2018-09-12 NOTE — Progress Notes (Signed)
CMU called.  Patient is in a fib.  I couldn't find any history of A fib.  12 lead EKG done x2.  Results reported to Dr. Darrick Meigs.

## 2018-09-12 NOTE — Anesthesia Procedure Notes (Signed)
Procedure Name: Intubation Date/Time: 09/12/2018 11:14 AM Performed by: Ollen Bowl, CRNA Pre-anesthesia Checklist: Patient identified, Patient being monitored, Timeout performed, Emergency Drugs available and Suction available Patient Re-evaluated:Patient Re-evaluated prior to induction Oxygen Delivery Method: Circle system utilized Preoxygenation: Pre-oxygenation with 100% oxygen Induction Type: IV induction Laryngoscope Size: Mac and 3 Grade View: Grade I Tube type: Oral Tube size: 7.0 mm Number of attempts: 1 Airway Equipment and Method: Stylet Placement Confirmation: ETT inserted through vocal cords under direct vision,  positive ETCO2 and breath sounds checked- equal and bilateral Secured at: 21 cm Tube secured with: Tape Dental Injury: Teeth and Oropharynx as per pre-operative assessment

## 2018-09-12 NOTE — Op Note (Signed)
09/12/2018  1:06 PM  PATIENT:  Shannon R Kiss Sr.  85 y.o. male  PRE-OPERATIVE DIAGNOSIS:  fracture left hip, intertrochanteric  POST-OPERATIVE DIAGNOSIS:  fracture left hip, intertrochanteric-  PROCEDURE:  Procedure(s): COMPRESSION HIP (Left)-27[address] Richards compression hip screw; 135 degree plate with 3 holes with 4.5 screws and 110 degrees lag screw and compression screw  SURGEON:  Surgeon(s) and Role:    * ,  E, MD - Primary  Dictation: The patient was seen in preop area left hip was confirmed as the surgical site marked as such chart review was completed and updated  The patient was taken to the operating room for general anesthesia and he was given 2 g of Ancef per SCIP protocol  He was placed on the Chick fracture table with appropriate padding of the perineal post and left foot and right leg which was placed in abduction external rotation  The left lower extremity was placed in traction and gentle traction internal rotation was used to reduce the fracture under C-arm guidance  After sterile prep and drape timeout was completed radiographs were available for review, implants had been checked and were available site was confirmed as left hip for internal fixation.  The incision was started at the tip of the greater trochanter extended distally followed by dividing subcutaneous tissue in line with the skin incision.  The fascia was identified and incised and carried proximally and distally exposing the vastus lateralis musculature which was split in line with the skin incision bluntly.  We encountered several perforating vessels which were clamped coagulated and cut  Subperiosteal dissection exposed the proximal femur.  A 135 degree guide was placed on the femur and a guidepin was placed in the center of the femoral head and confirmed with AP and lateral x-rays.  After measuring the pin to be 110 mm the triple reamer was set for 110 and passed over the guidewire.  135  degree 3 hole plate was placed with the lag screw over the guidewire and the guidewire was removed.  Starting with the first hole and compression mode 3.5 drill bit was used a depth gauge and 4.5 screws were placed sequentially  Radiographs confirmed all screw position and tip to apex distance was less than 25 mm  The wound was irrigated any extra bleeders were coagulated until the wound was dry.  60 cc of Marcaine was injected into the soft tissues.  The wound was closed in layered fashion with #1 Braylon for the vastus lateralis and fascia and 0 Monocryl for the subcutaneous tissue followed by staples.  Sterile dressing was applied  Postoperative plan Weightbearing as tolerated The patient is on a aspirin related blood thinner and can resume that Staples out at 2 weeks X-rays that 6 weeks and 12 weeks No hip precautions are needed  PHYSICIAN ASSISTANT:   ASSISTANTS: Betty Ashley   ANESTHESIA:  General  EBL:  none   BLOOD ADMINISTERED:none  DRAINS: none   LOCAL MEDICATIONS USED:  MARCAINE, 60cc  SPECIMEN:  No Specimen  DISPOSITION OF SPECIMEN:  N/A  COUNTS:  YES  DICTATION: .Dragon Dictation  PLAN OF CARE: Discharge to home after PACU  PATIENT DISPOSITION:  PACU - hemodynamically stable.   Delay start of Pharmacological VTE agent (>24hrs) due to surgical blood loss or risk of bleeding: not applicable  

## 2018-09-12 NOTE — Anesthesia Preprocedure Evaluation (Signed)
Anesthesia Evaluation  Patient identified by MRN, date of birth, ID band Patient awake    Reviewed: Allergy & Precautions, NPO status , Patient's Chart, lab work & pertinent test results  Airway Mallampati: II  TM Distance: >3 FB Neck ROM: Full    Dental no notable dental hx. (+) Edentulous Upper, Edentulous Lower   Pulmonary neg pulmonary ROS, former smoker,    Pulmonary exam normal breath sounds clear to auscultation       Cardiovascular Exercise Tolerance: Good hypertension, Pt. on medications negative cardio ROS Normal cardiovascular examI Rhythm:Regular Rate:Normal  Denies any current Cardiac issues other than HTN on meds    Neuro/Psych H/o Small CVA in 2002 obtained from Ex wife/POA On Aggrenox  Confused -which is reportedly baseline -pleasant  S/p fall  -Head CT  CVA, No Residual Symptoms negative psych ROS   GI/Hepatic negative GI ROS, Neg liver ROS,   Endo/Other  negative endocrine ROSdiabetes, Well Controlled, Type 2, Oral Hypoglycemic Agents  Renal/GU negative Renal ROS  negative genitourinary   Musculoskeletal  (+) Arthritis , Osteoarthritis,    Abdominal   Peds negative pediatric ROS (+)  Hematology negative hematology ROS (+)   Anesthesia Other Findings   Reproductive/Obstetrics negative OB ROS                             Anesthesia Physical Anesthesia Plan  ASA: III  Anesthesia Plan: General   Post-op Pain Management:    Induction: Intravenous  PONV Risk Score and Plan: 2 and Ondansetron and Treatment may vary due to age or medical condition  Airway Management Planned: Oral ETT  Additional Equipment:   Intra-op Plan:   Post-operative Plan: Extubation in OR  Informed Consent: I have reviewed the patients History and Physical, chart, labs and discussed the procedure including the risks, benefits and alternatives for the proposed anesthesia with the patient  or authorized representative who has indicated his/her understanding and acceptance.     Dental advisory given  Plan Discussed with: CRNA  Anesthesia Plan Comments: (Plan Full PPE use  Plan GETA -consent obtained from Ex wife and witnessed by preop RN after Q&A Full Code )        Anesthesia Quick Evaluation

## 2018-09-12 NOTE — TOC Initial Note (Signed)
Transition of Care (TOC) - Initial/Assessment Note    Patient Details  Name: Shannon Cooke. MRN: [id number] Date of Birth: Apr 27, 1933  Transition of Care [facility]) CM/SW Contact:    Boneta Lucks, RN Phone Number: 09/12/2018, 12:59 PM  Clinical Narrative:     Patient admitted with Close Left hip fracture, currently in surgery .  Patient live alone in an apartment. Ex-wife lives at the same apartment building and is his POA, states they have a better relationship living separately. Monna-Rae - POA is requesting patient got to [facility] for rehab. FL2 done and submitted. Pending PT evaluation.               Expected Discharge Plan: Skilled Nursing Facility Barriers to Discharge: Continued Medical Work up   Patient Goals and CMS Choice Patient states their goals for this hospitalization and ongoing recovery are:: To go to SNF CMS Medicare.gov Compare Post Acute Care list provided to:: Patient Represenative (must comment)(Monna- Cyndi Bender) Choice offered to / list presented to : [facility] POA / Guardian  Expected Discharge Plan and Services Expected Discharge Plan: [place]   Discharge Planning Services: CM Consult Post Acute Care Choice: [place]   Expected Discharge Date: 09/14/18                                    Prior Living Arrangements/Services   Lives with:: Self Patient language and need for interpreter reviewed:: Yes Do you feel safe going back to the place where you live?: No(after rehab)      Need for Family Participation in Patient Care: Yes (Comment) Care giver support system in place?: Yes (comment)   Criminal Activity/Legal Involvement Pertinent to Current Situation/Hospitalization: No - Comment as needed  Activities of Daily Living Home Assistive Devices/Equipment: None ADL Screening (condition at time of admission) Patient's cognitive ability adequate to safely complete daily activities?: Yes Is the patient deaf or  have difficulty hearing?: No Does the patient have difficulty seeing, even when wearing glasses/contacts?: No Does the patient have difficulty concentrating, remembering, or making decisions?: No Patient able to express need for assistance with ADLs?: Yes Does the patient have difficulty dressing or bathing?: No Independently performs ADLs?: Yes (appropriate for developmental age) Does the patient have difficulty walking or climbing stairs?: No Weakness of Legs: None Weakness of Arms/Hands: None  Permission Sought/Granted Permission sought to share information with : Case Freight forwarder, Investment banker, corporate granted to share info w AGENCY: [employer]        Emotional Assessment       Orientation: : Oriented to Self, Oriented to Place, Oriented to  Time, Oriented to Situation Alcohol / Substance Use: Not Applicable Psych Involvement: No (comment)  Admission diagnosis:  Femur fracture, left ([place]) [S72.92XA] Patient Active Problem List   Diagnosis Date Noted  . Closed left hip fracture ([place]) 09/11/2018   PCP:  Dione Housekeeper, MD Pharmacy:   [id number] [place], [place] [address] Phone: [phone] Fax: [phone]        Readmission Risk Interventions No flowsheet data found.

## 2018-09-12 NOTE — Transfer of Care (Signed)
Immediate Anesthesia Transfer of Care Note  Patient: Shannon Cooke.  Procedure(s) Performed: COMPRESSION HIP (Left Hip)  Patient Location: PACU  Anesthesia Type:General  Level of Consciousness: awake  Airway & Oxygen Therapy: Patient Spontanous Breathing  Post-op Assessment: Report given to RN  Post vital signs: Reviewed and stable  Last Vitals:  Vitals Value Taken Time  BP 113/58 09/12/18 1330  Temp    Pulse 76 09/12/18 1331  Resp 18 09/12/18 1331  SpO2 100 % 09/12/18 1331  Vitals shown include unvalidated device data.  Last Pain:  Vitals:   09/12/18 0939  TempSrc: Oral  PainSc: 0-No pain         Complications: No apparent anesthesia complications

## 2018-09-12 NOTE — Progress Notes (Signed)
Patient returned to room from PACU via stretcher. Report taken from PACU staff. Patient awake and alert, oriented x3. Skin warm and dry, surgical dressing dry and intact to left hip. Bilat ted hose and SCD intact. Foley cath intact with clear yellow urine. VSS. Telemetry on, SR 80's. No distress.

## 2018-09-12 NOTE — NC FL2 (Signed)
[place] MEDICAID FL2 LEVEL OF CARE SCREENING TOOL     IDENTIFICATION  Patient Name: Shannon PuntJames R Goodall Sr. Birthdate: [birth year] Sex: male Admission Date (Current Location): 09/11/2018  [facility] [place]   Facility and Address:  [facility],  [address], [place] [id number]      Provider Number: [id number]  Attending Physician Name and Address:  Cleora FleetJohnson, Clanford L, MD  Relative Name and Phone Number:       Current Level of Care: Hospital Recommended Level of Care: Skilled Nursing Facility Prior Approval Number:    Date Approved/Denied:   PASRR Number: [id number]  Discharge Plan: SNF    Current Diagnoses: Patient Active Problem List   Diagnosis Date Noted  . Closed left hip fracture (H[facility]) 09/11/2018    Orientation RESPIRATION BLADDER Height & Weight     Self, Time, Situation, Place  Normal Continent Weight: 76 kg Height:  6\' 2"  (188 cm)  BEHAVIORAL SYMPTOMS/MOOD NEUROLOGICAL BOWEL NUTRITION STATUS      Continent Diet(see discharge summary)  AMBULATORY STATUS COMMUNICATION OF NEEDS Skin   Extensive Assist Verbally Surgical wounds(left hip)                       Personal Care Assistance Level of Assistance  Bathing, Feeding, Dressing Bathing Assistance: Maximum assistance Feeding assistance: Limited assistance Dressing Assistance: Maximum assistance     Functional Limitations Info  Sight, Hearing, Speech Sight Info: Adequate Hearing Info: Adequate Speech Info: Adequate    SPECIAL CARE FACTORS FREQUENCY  PT (By licensed PT)     PT Frequency: 5 times a week              Contractures Contractures Info: Not present    Additional Factors Info  Code Status, Allergies, Insulin Sliding Scale Code Status Info: Full Allergies Info: NKDA   Insulin Sliding Scale Info: CBG 70-120 0 units  121-150 1 unit 151-200 2 unit  201-250 3 units 251-300 5 units 301-350 7 unit 351-400 9 units  greater than 400 call MD        Current Medications (09/12/2018):  This is the current hospital active medication list Current Facility-Administered Medications  Medication Dose Route Frequency Provider Last Rate Last Dose  . [MAR Hold] 0.9 %  sodium chloride infusion (Manually program via Guardrails IV Fluids)   Intravenous Once Vickki HearingHarrison, Stanley E, MD      . 0.9 % irrigation (POUR BTL)    PRN Vickki HearingHarrison, Stanley E, MD   1,000 mL at 09/12/18 1148  . [MAR Hold] acetaminophen (TYLENOL) tablet 650 mg  650 mg Oral Q6H PRN Emokpae, Ejiroghene E, MD       Or  . Mitzi Hansen[MAR Hold] acetaminophen (TYLENOL) suppository 650 mg  650 mg Rectal Q6H PRN Emokpae, Ejiroghene E, MD      . Mitzi Hansen[MAR Hold] insulin aspart (novoLOG) injection 0-9 Units  0-9 Units Subcutaneous Q6H Emokpae, Ejiroghene E, MD   1 Units at 09/12/18 0625  . lactated ringers infusion   Intravenous Continuous Dorena CookeyNabonsal, Jeff S, MD 50 mL/hr at 09/12/18 0948    . [MAR Hold] morphine 2 MG/ML injection 2 mg  2 mg Intravenous Q4H PRN Emokpae, Ejiroghene E, MD      . Mitzi Hansen[MAR Hold] mupirocin ointment (BACTROBAN) 2 % 1 application  1 application Nasal BID Vickki HearingHarrison, Stanley E, MD   1 application at 09/12/18 [phone]  . [MAR Hold] ondansetron (ZOFRAN) tablet 4 mg  4 mg Oral Q6H PRN Emokpae, Ejiroghene  E, MD       Or  . Doug Sou Hold] ondansetron (ZOFRAN) injection 4 mg  4 mg Intravenous Q6H PRN Emokpae, Ejiroghene E, MD      . Doug Sou Hold] polyethylene glycol (MIRALAX / GLYCOLAX) packet 17 g  17 g Oral Daily PRN Emokpae, Ejiroghene E, MD      . povidone-iodine 10 % swab 2 application  2 application Topical Once Carole Civil, MD       Facility-Administered Medications Ordered in Other Encounters  Medication Dose Route Frequency Provider Last Rate Last Dose  . fentaNYL (SUBLIMAZE) injection    Anesthesia Intra-op Ollen Bowl, CRNA   25 mcg at 09/12/18 1117  . phenylephrine (NEO-SYNEPHRINE) injection    Anesthesia Intra-op Ollen Bowl, CRNA   80 mcg at 09/12/18 1134  . propofol (DIPRIVAN) 10  mg/mL bolus/IV push    Anesthesia Intra-op Ollen Bowl, CRNA   120 mg at 09/12/18 1112  . rocuronium bromide 10 mg/mL (PF) syringe (58mL)   Intravenous Anesthesia Intra-op Ollen Bowl, CRNA   10 mg at 09/12/18 1202  . succinylcholine (ANECTINE) injection    Anesthesia Intra-op Ollen Bowl, CRNA   120 mg at 09/12/18 1112     Discharge Medications: Please see discharge summary for a list of discharge medications.  Relevant Imaging Results:  Relevant Lab Results:   Additional Information SS # [id number]    EX- Wife is POA  Monna-Rae  [phone]  Boneta Lucks, RN

## 2018-09-12 NOTE — Brief Op Note (Signed)
09/12/2018  1:06 PM  PATIENT:  Shannon Cooke.  85 y.o. male  PRE-OPERATIVE DIAGNOSIS:  fracture left hip, intertrochanteric  POST-OPERATIVE DIAGNOSIS:  fracture left hip, intertrochanteric-  PROCEDURE:  Procedure(s): COMPRESSION HIP [phone] Richards compression hip screw; 135 degree plate with 3 holes with 4.5 screws and 110 degrees lag screw and compression screw  SURGEON:  Surgeon(s) and Role:    Carole Civil, MD - Primary  Dictation: The patient was seen in preop area left hip was confirmed as the surgical site marked as such chart review was completed and updated  The patient was taken to the operating room for general anesthesia and he was given 2 g of Ancef per SCIP protocol  He was placed on the Chick fracture table with appropriate padding of the perineal post and left foot and right leg which was placed in abduction external rotation  The left lower extremity was placed in traction and gentle traction internal rotation was used to reduce the fracture under C-arm guidance  After sterile prep and drape timeout was completed radiographs were available for review, implants had been checked and were available site was confirmed as left hip for internal fixation.  The incision was started at the tip of the greater trochanter extended distally followed by dividing subcutaneous tissue in line with the skin incision.  The fascia was identified and incised and carried proximally and distally exposing the vastus lateralis musculature which was split in line with the skin incision bluntly.  We encountered several perforating vessels which were clamped coagulated and cut  Subperiosteal dissection exposed the proximal femur.  A 135 degree guide was placed on the femur and a guidepin was placed in the center of the femoral head and confirmed with AP and lateral x-rays.  After measuring the pin to be 110 mm the triple reamer was set for 110 and passed over the guidewire.  135  degree 3 hole plate was placed with the lag screw over the guidewire and the guidewire was removed.  Starting with the first hole and compression mode 3.5 drill bit was used a depth gauge and 4.5 screws were placed sequentially  Radiographs confirmed all screw position and tip to apex distance was less than 25 mm  The wound was irrigated any extra bleeders were coagulated until the wound was dry.  60 cc of Marcaine was injected into the soft tissues.  The wound was closed in layered fashion with #1 Braylon for the vastus lateralis and fascia and 0 Monocryl for the subcutaneous tissue followed by staples.  Sterile dressing was applied  Postoperative plan Weightbearing as tolerated The patient is on a aspirin related blood thinner and can resume that Staples out at 2 weeks X-rays that 6 weeks and 12 weeks No hip precautions are needed  PHYSICIAN ASSISTANT:   ASSISTANTS: Simonne Maffucci   ANESTHESIA:  General  EBL:  none   BLOOD ADMINISTERED:none  DRAINS: none   LOCAL MEDICATIONS USED:  MARCAINE, 60cc  SPECIMEN:  No Specimen  DISPOSITION OF SPECIMEN:  N/A  COUNTS:  YES  DICTATION: .Dragon Dictation  PLAN OF CARE: Discharge to home after PACU  PATIENT DISPOSITION:  PACU - hemodynamically stable.   Delay start of Pharmacological VTE agent (>24hrs) due to surgical blood loss or risk of bleeding: not applicable

## 2018-09-12 NOTE — Progress Notes (Signed)
PROGRESS NOTE    Shannon CaulJames R Bowlby Sr.  WUJ:[id number]  DOB: 02-15-1934  DOA: 09/11/2018 PCP: Joette CatchingNyland, Leonard, MD   Brief Admission Hx: 85 year old male with history of stroke, hypertension, type 2 diabetes mellitus brought in to ED after a fall with an acute left hip fracture.  MDM/Assessment & Plan:   1. POD#0 s/p left femur fracture repair - Postop care per Dr. [name], restarted home aggrenox.  2. H/o CVA - resumed home aggrenox.  3. Hypertension - resumed home medications.  4. Prolonged QTc - cardiac monitoring.   DVT prophylaxis: SCDs  Code Status: Full  Family Communication:  Disposition Plan: inpatient    Consultants:  Orthopedics Romeo Apple(Harrison)  Procedures:  ORIF left hip fracture 7/28     Subjective: Pt says he has some discomfort but overall no complaints.    Objective: Vitals:   09/12/18 1345 09/12/18 1400 09/12/18 1404 09/12/18 1423  BP: 116/60 116/69 118/60 119/64  Pulse: 93 90 86 83  Resp: 16 18 19 18   Temp:   97.9 F (36.6 C) 98.2 F (36.8 C)  TempSrc:      SpO2: 100% 92% 93% 100%  Weight:      Height:        Intake/Output Summary (Last 24 hours) at 09/12/2018 1558 Last data filed at 09/12/2018 1331 Gross per 24 hour  Intake 2506.64 ml  Output 450 ml  Net 2056.64 ml   Filed Weights   09/11/18 1535 09/11/18 2118 09/12/18 0939  Weight: 77.1 kg 76 kg 76 kg   REVIEW OF SYSTEMS  As per history otherwise all reviewed and reported negative  Exam:  General exam: awake, alert, NAD, cooperative.  Respiratory system: Clear. No increased work of breathing. Cardiovascular system: normal S1 & S2 heard. No JVD, murmurs, gallops, clicks or pedal edema. Gastrointestinal system: Abdomen is nondistended, soft and nontender. Normal bowel sounds heard. Central nervous system: Alert and oriented. No focal neurological deficits. Extremities: bilateral TED hoses in place.   Data Reviewed: Basic Metabolic Panel: Recent Labs  Lab 09/11/18 1607 09/12/18  0849  NA 139 140  K 4.1 4.2  CL 111 110  CO2 22 19*  GLUCOSE 112* 131*  BUN 21 26*  CREATININE 1.17 1.05  CALCIUM 8.9 8.3*  MG 2.1  --    Liver Function Tests: No results for input(s): AST, ALT, ALKPHOS, BILITOT, PROT, ALBUMIN in the last 168 hours. No results for input(s): LIPASE, AMYLASE in the last 168 hours. No results for input(s): AMMONIA in the last 168 hours. CBC: Recent Labs  Lab 09/11/18 1607 09/12/18 0849  WBC 9.3 10.4  NEUTROABS 7.8*  --   HGB 12.1* 10.9*  HCT 36.0* 33.1*  MCV 100.3* 101.2*  PLT 126* 108*   Cardiac Enzymes: No results for input(s): CKTOTAL, CKMB, CKMBINDEX, TROPONINI in the last 168 hours. CBG (last 3)  Recent Labs    09/12/18 0613 09/12/18 0927 09/12/18 1346  GLUCAP 130* 124* 142*   Recent Results (from the past 240 hour(s))  SARS Coronavirus 2 (CEPHEID - Performed in [facility] hospital lab), Hosp Order     Status: None   Collection Time: 09/11/18  3:57 PM   Specimen: Nasopharyngeal Swab  Result Value Ref Range Status   SARS Coronavirus 2 NEGATIVE NEGATIVE Final    Comment: (NOTE) If result is NEGATIVE SARS-CoV-2 target nucleic acids are NOT DETECTED. The SARS-CoV-2 RNA is generally detectable in upper and lower  respiratory specimens during the acute phase of infection. The lowest  concentration  of SARS-CoV-2 viral copies this assay can detect is 250  copies / mL. A negative result does not preclude SARS-CoV-2 infection  and should not be used as the sole basis for treatment or other  patient management decisions.  A negative result may occur with  improper specimen collection / handling, submission of specimen other  than nasopharyngeal swab, presence of viral mutation(s) within the  areas targeted by this assay, and inadequate number of viral copies  (<250 copies / mL). A negative result must be combined with clinical  observations, patient history, and epidemiological information. If result is POSITIVE SARS-CoV-2 target  nucleic acids are DETECTED. The SARS-CoV-2 RNA is generally detectable in upper and lower  respiratory specimens dur ing the acute phase of infection.  Positive  results are indicative of active infection with SARS-CoV-2.  Clinical  correlation with patient history and other diagnostic information is  necessary to determine patient infection status.  Positive results do  not rule out bacterial infection or co-infection with other viruses. If result is PRESUMPTIVE POSTIVE SARS-CoV-2 nucleic acids MAY BE PRESENT.   A presumptive positive result was obtained on the submitted specimen  and confirmed on repeat testing.  While 2019 novel coronavirus  (SARS-CoV-2) nucleic acids may be present in the submitted sample  additional confirmatory testing may be necessary for epidemiological  and / or clinical management purposes  to differentiate between  SARS-CoV-2 and other Sarbecovirus currently known to infect humans.  If clinically indicated additional testing with an alternate test  methodology [phone]) is advised. The SARS-CoV-2 RNA is generally  detectable in upper and lower respiratory sp ecimens during the acute  phase of infection. The expected result is Negative. Fact Sheet for Patients:  BoilerBrush.com.cyhttps://www.fda.gov/media/136312/download Fact Sheet for Healthcare Providers: https://pope.com/https://www.fda.gov/media/136313/download This test is not yet approved or cleared by the [place] FDA and has been authorized for detection and/or diagnosis of SARS-CoV-2 by FDA under an Emergency Use Authorization (EUA).  This EUA will remain in effect (meaning this test can be used) for the duration of the COVID-19 declaration under Section 564(b)(1) of the Act, 21 U.S.C. section 360bbb-3(b)(1), unless the authorization is terminated or revoked sooner. Performed at [facility], [address] [place], [place] [address]   Surgical PCR screen     Status: None   Collection Time: 09/11/18 10:50 PM   Specimen:  Nasal Mucosa; Nasal Swab  Result Value Ref Range Status   MRSA, PCR NEGATIVE NEGATIVE Final   Staphylococcus aureus NEGATIVE NEGATIVE Final    Comment: (NOTE) The Xpert SA Assay (FDA approved for NASAL specimens in patients 83 years of age and older), is one component of a comprehensive surveillance program. It is not intended to diagnose infection nor to guide or monitor treatment. Performed at [facility], [address] [place], [place] [address]      Studies: Dg Chest 1 View  Result Date: 09/11/2018 CLINICAL DATA:  Pre operative respiratory exam.  Left hip fracture. EXAM: CHEST  1 VIEW COMPARISON:  None. FINDINGS: The heart size and mediastinal contours are within normal limits. Both lungs are clear. The visualized skeletal structures are unremarkable. IMPRESSION: No active disease. Electronically Signed   By: Francene BoyersJames  Maxwell M.D.   On: 09/11/2018 16:43   Ct Head Wo Contrast  Result Date: 09/11/2018 CLINICAL DATA:  Fall. EXAM: CT HEAD WITHOUT CONTRAST TECHNIQUE: Contiguous axial images were obtained from the base of the skull through the vertex without intravenous contrast. COMPARISON:  Head MRI 08/14/2005 FINDINGS: Brain: There  is no evidence of acute infarct, intracranial hemorrhage, mass, midline shift, or extra-axial fluid collection. Patchy to confluent hypodensities in the cerebral white matter bilaterally are nonspecific but compatible with moderate chronic small vessel ischemic disease, progressive from the 2007 MRI. There is mild cerebral atrophy. Focal encephalomalacia inferiorly in the left temporal lobe is new from 2007. Vascular: Calcified atherosclerosis at the skull base. No hyperdense vessel. Skull: No acute fracture. Chronic postoperative changes involving the frontal skull including frontal sinus. Sinuses/Orbits: No evidence of acute sinus disease. Bilateral cataract extraction. Other: None. IMPRESSION: 1. No evidence of acute intracranial abnormality. 2. Moderate chronic  small vessel ischemic disease. 3. Focal encephalomalacia in the left temporal lobe, new from 2007 and possibly reflective of old trauma. Electronically Signed   By: Logan Bores M.D.   On: 09/11/2018 19:03   Dg Hip Operative Unilat With Pelvis Left  Result Date: 09/12/2018 CLINICAL DATA:  Portable fluoroscopic imaging for left hip ORIF. EXAM: OPERATIVE LEFT HIP (WITH PELVIS IF PERFORMED) 9 VIEWS TECHNIQUE: Fluoroscopic spot image(s) were submitted for interpretation post-operatively. COMPARISON:  None. FINDINGS: Submitted views show placement of a lateral fixation plate supporting a compression screw, reducing the intertrochanteric primary fracture components into near anatomic alignment. No evidence of an operative complication. IMPRESSION: Well aligned proximal left femur fracture following ORIF. Electronically Signed   By: Lajean Manes M.D.   On: 09/12/2018 15:17   Dg Hip Unilat W Or Wo Pelvis 2-3 Views Left  Result Date: 09/11/2018 CLINICAL DATA:  Left hip pain secondary to a fall today. EXAM: DG HIP (WITH OR WITHOUT PELVIS) 2-3V LEFT COMPARISON:  None. FINDINGS: There is a comminuted intertrochanteric fracture proximal left femur with slight distraction. Diffuse osteopenia. Superolateral joint space narrowing at the left hip. Pelvic bones are intact. IMPRESSION: Comminuted intertrochanteric fracture of the proximal left femur. Electronically Signed   By: Lorriane Shire M.D.   On: 09/11/2018 16:42     Scheduled Meds: . sodium chloride   Intravenous Once  . dipyridamole-aspirin  1 capsule Oral BID  . docusate sodium  100 mg Oral BID  . Folic Acid-Vit I1-WER X54  1 tablet Oral Daily  . insulin aspart  0-9 Units Subcutaneous TID WC  . lisinopril  10 mg Oral Daily  . mupirocin ointment  1 application Nasal BID  . traMADol  50 mg Oral Q6H   Continuous Infusions: . sodium chloride    .  ceFAZolin (ANCEF) IV    . methocarbamol (ROBAXIN) IV      Principal Problem:   Displaced  intertrochanteric fracture of right femur, initial encounter for closed fracture [facility]) Active Problems:   Hypertension   Diabetes mellitus   History of Stroke  Time spent:   Irwin Brakeman, MD [facility] 09/12/2018, 3:58 PM    LOS: 1 day  How to contact the [facility] Attending or Consulting provider [place] or covering provider during after hours [place], for this patient?  1. Check the care team in [facility] and look for a) attending/consulting [facility] provider listed and b) the [facility] team listed 2. Log into [email or web address] and use [place]'s universal password to access. If you do not have the password, please contact the hospital operator. 3. Locate the [facility] provider you are looking for under [facility] and page to a number that you can be directly reached. 4. If you still have difficulty reaching the provider, please page the [facility] (Director on Call) for the Hospitalists listed on amion for assistance.

## 2018-09-12 NOTE — Interval H&P Note (Signed)
History and Physical Interval Note:  09/12/2018 11:01 AM  Shannon Cooke Sr.  has presented today for surgery, with the diagnosis of fracture left hip.  The various methods of treatment have been discussed with the patient and family. After consideration of risks, benefits and other options for treatment, the patient has consented to  Procedure(s): COMPRESSION HIP (Left) as a surgical intervention.  The patient's history has been reviewed, patient examined, no change in status, stable for surgery.  I have reviewed the patient's chart and labs.  Questions were answered to the patient's satisfaction.     Arther Abbott

## 2018-09-12 NOTE — Anesthesia Postprocedure Evaluation (Signed)
Anesthesia Post Note  Patient: Shannon Fredrickson Sr.  Procedure(s) Performed: COMPRESSION HIP (Left Hip)  Patient location during evaluation: PACU Anesthesia Type: General Level of consciousness: awake and alert Pain management: pain level controlled Vital Signs Assessment: post-procedure vital signs reviewed and stable Respiratory status: spontaneous breathing Cardiovascular status: blood pressure returned to baseline and stable Postop Assessment: no apparent nausea or vomiting Anesthetic complications: no     Last Vitals:  Vitals:   09/12/18 1330 09/12/18 1345  BP: (!) 113/58 116/60  Pulse: 73 93  Resp: 16 16  Temp:    SpO2: 100% 100%    Last Pain:  Vitals:   09/12/18 1345  TempSrc:   PainSc: 0-No pain                 Shyah Cadmus

## 2018-09-13 ENCOUNTER — Inpatient Hospital Stay (HOSPITAL_COMMUNITY): Payer: Medicare Other

## 2018-09-13 ENCOUNTER — Encounter (HOSPITAL_COMMUNITY): Payer: Self-pay | Admitting: Orthopedic Surgery

## 2018-09-13 DIAGNOSIS — I4891 Unspecified atrial fibrillation: Secondary | ICD-10-CM

## 2018-09-13 DIAGNOSIS — I639 Cerebral infarction, unspecified: Secondary | ICD-10-CM

## 2018-09-13 DIAGNOSIS — I4892 Unspecified atrial flutter: Secondary | ICD-10-CM

## 2018-09-13 LAB — GLUCOSE, CAPILLARY
Glucose-Capillary: 143 mg/dL — ABNORMAL HIGH (ref 70–99)
Glucose-Capillary: 147 mg/dL — ABNORMAL HIGH (ref 70–99)
Glucose-Capillary: 127 mg/dL — ABNORMAL HIGH (ref 70–99)
Glucose-Capillary: 141 mg/dL — ABNORMAL HIGH (ref 70–99)

## 2018-09-13 LAB — ECHOCARDIOGRAM COMPLETE
Height: 74 in
Weight: 2680.79 oz

## 2018-09-13 LAB — BASIC METABOLIC PANEL
Anion gap: 7 (ref 5–15)
BUN: 26 mg/dL — ABNORMAL HIGH (ref 8–23)
CO2: 23 mmol/L (ref 22–32)
Calcium: 7.7 mg/dL — ABNORMAL LOW (ref 8.9–10.3)
Chloride: 107 mmol/L (ref 98–111)
Creatinine, Ser: 1.15 mg/dL (ref 0.61–1.24)
GFR calc Af Amer: >60 mL/min (ref >60)
GFR calc non Af Amer: 59 mL/min — ABNORMAL LOW (ref >60)
Glucose, Bld: 166 mg/dL — ABNORMAL HIGH (ref 70–99)
Potassium: 4.1 mmol/L (ref 3.5–5.1)
Sodium: 137 mmol/L (ref 135–145)

## 2018-09-13 LAB — CBC
HCT: 28 % — ABNORMAL LOW (ref 39.0–52.0)
Hemoglobin: 9.3 g/dL — ABNORMAL LOW (ref 13.0–17.0)
MCH: 33.8 pg (ref 26.0–34.0)
MCHC: 33.2 g/dL (ref 30.0–36.0)
MCV: 101.8 fL — ABNORMAL HIGH (ref 80.0–100.0)
Platelets: 111 10*3/uL — ABNORMAL LOW (ref 150–400)
RBC: 2.75 MIL/uL — ABNORMAL LOW (ref 4.22–5.81)
RDW: 12.9 % (ref 11.5–15.5)
WBC: 16 10*3/uL — ABNORMAL HIGH (ref 4.0–10.5)
nRBC: 0 % (ref 0.0–0.2)

## 2018-09-13 LAB — MAGNESIUM: Magnesium: 1.9 mg/dL (ref 1.7–2.4)

## 2018-09-13 MED ORDER — APIXABAN 5 MG PO TABS
5.0000 mg | ORAL_TABLET | Freq: Two times a day (BID) | ORAL | Status: DC
  Administered 2018-09-13: 5 mg via ORAL

## 2018-09-13 MED ORDER — DIGOXIN 0.25 MG/ML IJ SOLN
0.2500 mg | Freq: Once | INTRAMUSCULAR | Status: AC
  Administered 2018-09-13: 0.25 mg via INTRAVENOUS
  Filled 2018-09-13: qty 2

## 2018-09-13 MED ORDER — METOPROLOL TARTRATE 25 MG PO TABS
25.0000 mg | ORAL_TABLET | Freq: Two times a day (BID) | ORAL | Status: DC
  Administered 2018-09-13: 25 mg via ORAL
  Filled 2018-09-13 (×2): qty 1

## 2018-09-13 MED ORDER — APIXABAN 5 MG PO TABS
5.0000 mg | ORAL_TABLET | Freq: Two times a day (BID) | ORAL | Status: DC
  Administered 2018-09-13 – 2018-09-20 (×14): 5 mg via ORAL
  Filled 2018-09-13 (×15): qty 1

## 2018-09-13 NOTE — Evaluation (Signed)
Occupational Therapy Evaluation Patient Details Name: Shannon PuntJames R Wheat Sr. MRN: [id number] DOB: 22-Nov-1933 Today's Date: 09/13/2018    History of Present Illness Shannon CaulJames R Ijames Sr. is a 85 y.o. male s/p Left hip ORIF,  09/12/18 secondary to fall with medical history significant for remote stroke, hypertension, diabetes mellitus, who was brought to the ED after patient fell today.  Patient appears slightly confused-but per care everywhere on his visits, he is sometimes confused, but he is able to answer a few questions appropriately. Patient tells me he was trying to get out of his friend's car, when he fell.  He says he might have slipped.  He tells me he hit his head a bit.  He denies any chest pain or difficulty breathing.  Denies dizziness, cough, fever or chills, vomiting or loose stools.   Clinical Impression   Pt received supine in bed, agreeable to OT evaluation, PT joined shortly after beginning for co-evaluation. PTA pt independent in ADL completion, limited during evaluation due to LLE pain and weakness. Pt able to perform seated ADLs in a chair with back support, unable to complete at EOB due to poor sitting balance-pt leaning to right to alleviate pressure on LLE during sitting. Pt is unable to complete standing ADLs at this time. Recommend SNF on discharge to improve safety and independence during ADL completion, as well as improve strength required for ADLs. No further OT needs during acute stay, defer further OT treatment to SNF.     Follow Up Recommendations  SNF    Equipment Recommendations  None recommended by OT       Precautions / Restrictions Precautions Precautions: Fall Restrictions Weight Bearing Restrictions: Yes LLE Weight Bearing: Weight bearing as tolerated      Mobility Bed Mobility Overal bed mobility: Needs Assistance Bed Mobility: Supine to Sit     Supine to sit: Mod assist;Max assist     General bed mobility comments: increased time, labored movement,  had difficulty propping up on elbows due to weakness, requires assistance to move LLE  Transfers Overall transfer level: Needs assistance Equipment used: Rolling walker (2 wheeled) Transfers: Sit to/from UGI CorporationStand;Stand Pivot Transfers Sit to Stand: Mod assist Stand pivot transfers: Mod assist;Max assist       General transfer comment: requires assistance to help move LLE when standing        ADL either performed or assessed with clinical judgement   ADL Overall ADL's : Needs assistance/impaired Eating/Feeding: Modified independent;Sitting   Grooming: Set up;Sitting Grooming Details (indicate cue type and reason): unable to stand at sink for ADLs         Upper Body Dressing : Moderate assistance;Sitting Upper Body Dressing Details (indicate cue type and reason): assist for managing gown ties and buttons Lower Body Dressing: Total assistance;Bed level Lower Body Dressing Details (indicate cue type and reason): donning socks Toilet Transfer: Maximal assistance;Stand-pivot;BSC;RW Toilet Transfer Details (indicate cue type and reason): simulated with bed to chair transfer           General ADL Comments: Pt requiring increased assistance with ADLs due to LLE pain and weakness     Vision Baseline Vision/History: Wears glasses Wears Glasses: Reading only              Pertinent Vitals/Pain Pain Assessment: Faces Faces Pain Scale: Hurts even more Pain Location: left hip with movement Pain Descriptors / Indicators: Grimacing;Sore Pain Intervention(s): Limited activity within patient's tolerance;Monitored during session;Premedicated before session;Repositioned;Ice applied     Hand Dominance Right  Extremity/Trunk Assessment Upper Extremity Assessment Upper Extremity Assessment: Generalized weakness   Lower Extremity Assessment Lower Extremity Assessment: Generalized weakness;LLE deficits/detail;RLE deficits/detail RLE Deficits / Details: grossly 4/5, limited right  knee flexion due to stiffness 0-100 degrees LLE Deficits / Details: grossly 3+/5 LLE: Unable to fully assess due to pain LLE Sensation: WNL LLE Coordination: WNL   Cervical / Trunk Assessment Cervical / Trunk Assessment: Normal   Communication Communication Communication: No difficulties   Cognition Arousal/Alertness: Awake/alert Behavior During Therapy: WFL for tasks assessed/performed Overall Cognitive Status: Within Functional Limits for tasks assessed                                                Home Living Family/patient expects to be discharged to:: Private residence Living Arrangements: Alone Available Help at Discharge: Family;Available PRN/intermittently Type of Home: Apartment Home Access: Stairs to enter Entrance Stairs-Number of Steps: 1 Entrance Stairs-Rails: None Home Layout: One level     Bathroom Shower/Tub: Teacher, early years/pre: Standard Bathroom Accessibility: Yes   Home Equipment: Environmental consultant - 4 wheels;Bedside commode;Shower seat - built in;Grab bars - toilet          Prior Functioning/Environment Level of Independence: Independent with assistive device(s)        Comments: household ambulator witthout AD, uses Rollator for longer distances; independent in ADLs        OT Problem List: Decreased strength;Decreased activity tolerance;Impaired balance (sitting and/or standing);Decreased safety awareness;Decreased knowledge of use of DME or AE;Pain      OT Treatment/Interventions:      OT Goals(Current goals can be found in the care plan section) Acute Rehab OT Goals Patient Stated Goal: return home after rehab  OT Frequency:             Co-evaluation PT/OT/SLP Co-Evaluation/Treatment: Yes Reason for Co-Treatment: Complexity of the patient's impairments (multi-system involvement);For patient/therapist safety;To address functional/ADL transfers PT goals addressed during session: Mobility/safety with  mobility;Balance;Proper use of DME OT goals addressed during session: ADL's and self-care         End of Session Equipment Utilized During Treatment: Gait belt;Rolling walker  Activity Tolerance: Patient limited by pain Patient left: in chair;with call bell/phone within reach;with chair alarm set  OT Visit Diagnosis: Muscle weakness (generalized) (M62.81);Pain Pain - Right/Left: Left Pain - part of body: Hip                Time: 5397-6734 OT Time Calculation (min): 27 min Charges:  OT General Charges $OT Visit: 1 Visit OT Evaluation $OT Eval Low Complexity: [place], OTR/L  [phone] 09/13/2018, 8:47 AM

## 2018-09-13 NOTE — Anesthesia Postprocedure Evaluation (Signed)
Anesthesia Post Note  Patient: Shannon Fredrickson Sr.  Procedure(s) Performed: COMPRESSION HIP (Left Hip)  Patient location during evaluation: Nursing Unit Anesthesia Type: General Level of consciousness: awake and alert and oriented Pain management: pain level controlled Vital Signs Assessment: post-procedure vital signs reviewed and stable Respiratory status: spontaneous breathing and respiratory function stable Cardiovascular status: stable Postop Assessment: no apparent nausea or vomiting Anesthetic complications: no     Last Vitals:  Vitals:   09/13/18 0247 09/13/18 0653  BP: 96/66 (!) 96/53  Pulse: (!) 108   Resp: 20 20  Temp: 37 C 37.1 C  SpO2: 96% 92%    Last Pain:  Vitals:   09/13/18 0720  TempSrc:   PainSc: 0-No pain                 Kersti Scavone A

## 2018-09-13 NOTE — Progress Notes (Signed)
*  PRELIMINARY RESULTS* Echocardiogram 2D Echocardiogram has been performed.  Samuel Germany 09/13/2018, 3:49 PM

## 2018-09-13 NOTE — Progress Notes (Signed)
Patient ID: Shannon Cooke., male   DOB: 06/25/1933, 85 y.o.   MRN: [id number] Stop day 1 open treatment internal fixation inotrope fracture left hip treated with dynamic hip screw  The patient was awake today seem to be in good spirits dressing had minimal drainage limb alignment looks normal neurovascular exam is intact  Start physical therapy weight-bear as tolerated  CBC Latest Ref Rng & Units 09/13/2018 09/12/2018 09/11/2018  WBC 4.0 - 10.5 K/uL 16.0(H) 10.4 9.3  Hemoglobin 13.0 - 17.0 g/dL 9.3(L) 10.9(L) 12.1(L)  Hematocrit 39.0 - 52.0 % 28.0(L) 33.1(L) 36.0(L)  Platelets 150 - 400 K/uL 111(L) 108(L) 126(L)    BMP Latest Ref Rng & Units 09/13/2018 09/12/2018 09/11/2018  Glucose 70 - 99 mg/dL 166(H) 131(H) 112(H)  BUN 8 - 23 mg/dL 26(H) 26(H) 21  Creatinine 0.61 - 1.24 mg/dL 1.15 1.05 1.17  Sodium 135 - 145 mmol/L 137 140 139  Potassium 3.5 - 5.1 mmol/L 4.1 4.2 4.1  Chloride 98 - 111 mmol/L 107 110 111  CO2 22 - 32 mmol/L 23 19(L) 22  Calcium 8.9 - 10.3 mg/dL 7.7(L) 8.3(L) 8.9    Postoperative plan Weightbearing as tolerated The patient is on a aspirin related blood thinner and can resume that [employer] out at 2 weeks X-rays that 6 weeks and 12 weeks No hip precautions are needed

## 2018-09-13 NOTE — Progress Notes (Signed)
Went in to give patient's medications.  Patient had taken his telemetry off.  Pt had removed both PIVs.  2 new IVs initiated since patient switched for SR to A flutter and another access is needed.  Patient tolerated procedure well.  Wrapped both IVs with Kerlix.  Patient was pulling at foley.  Small amount of blood noted.  Foley care done.

## 2018-09-13 NOTE — Progress Notes (Signed)
Pt has only had 75 ml recorded urine output today since foley cath removed. Pt has had small episodes of incontinence. Abd soft, no bladder distention or tenderness noted with palpation. Pt states, "yea, I peed twice today in that jug", but only one episode recorded in chart. Oncoming nurse notified of urine output and findings for reassessment.

## 2018-09-13 NOTE — Progress Notes (Signed)
PROGRESS NOTE    Shannon CaulJames R Sult Sr.  ZOX:[id number]  DOB: 02-16-1934  DOA: 09/11/2018 PCP: Joette CatchingNyland, Leonard, MD   Brief Admission Hx:  85 year old male with history of stroke, hypertension, type 2 diabetes mellitus brought in to ED after a fall with an acute left hip fracture, status post surgical repair by Dr. [name] 7/28, patient developed a flutter/A. fib on 7/20 9 AM.   Subjective: Patient denies any complaints today, is sitting eating breakfast, he developed A. fib overnight  MDM/Assessment & Plan:   Left hip fracture  -Status post surgical repair by Dr. [name] 7/29  -PT/OT consulted  -Continue with PRN pain and nausea medication .  H/o CVA  -He denies any new focal deficits, he is on Aggrenox at home, this will be changed to Eliquis in the setting of atrial fibrillation.  New onset A. Fib -Heart rate is controlled, Cardizem to metoprolol, will start metoprolol 25 mg p.o. twice daily, given soft blood pressure I will give 1 dose of digoxin. - CHA2DS2VASc score is 5.  Will start on Eliquis  Hypertension  -Blood pressure is soft, will discontinue lisinopril as started on metoprolol for heart rate control   Prolonged QTc  - cardiac monitoring.   DVT prophylaxis: SCDs, started on Eliquis Code Status: Full  Family Communication: Discussed with patient Disposition Plan: Will need SNF placement   Consultants:  Orthopedics Romeo Apple(Harrison)  Procedures:  ORIF left hip fracture 7/28      Objective: Vitals:   09/12/18 2228 09/13/18 0247 09/13/18 0653 09/13/18 1033  BP: 106/60 96/66 (!) 96/53 101/62  Pulse: (!) 103 (!) 108    Resp: 20 20 20 20   Temp: 99.3 F (37.4 C) 98.6 F (37 C) 98.7 F (37.1 C) 98.6 F (37 C)  TempSrc: Oral Oral Oral Oral  SpO2: 93% 96% 92% 94%  Weight:      Height:        Intake/Output Summary (Last 24 hours) at 09/13/2018 1204 Last data filed at 09/13/2018 [id number] Gross per 24 hour  Intake 2157.5 ml  Output 725 ml  Net 1432.5 ml    Filed Weights   09/11/18 1535 09/11/18 2118 09/12/18 0939  Weight: 77.1 kg 76 kg 76 kg   REVIEW OF SYSTEMS  As per history otherwise all reviewed and reported negative  Exam:  Awake Alert, Oriented X 3, No new F.N deficits, Normal affect, sitting in recliner eating breakfast Chronic heel deformity in the forehead due to remote trauma Symmetrical Chest wall movement, Good air movement bilaterally, CTAB Irregular irregular,No Gallops,Rubs or new Murmurs, No Parasternal Heave +ve B.Sounds, Abd Soft, No tenderness, No rebound - guarding or rigidity. No Cyanosis, Clubbing or edema, No new Rash or bruise     Data Reviewed: Basic Metabolic Panel: Recent Labs  Lab 09/11/18 1607 09/12/18 0849 09/13/18 0545  NA 139 140 137  K 4.1 4.2 4.1  CL 111 110 107  CO2 22 19* 23  GLUCOSE 112* 131* 166*  BUN 21 26* 26*  CREATININE 1.17 1.05 1.15  CALCIUM 8.9 8.3* 7.7*  MG 2.1  --  1.9   Liver Function Tests: No results for input(s): AST, ALT, ALKPHOS, BILITOT, PROT, ALBUMIN in the last 168 hours. No results for input(s): LIPASE, AMYLASE in the last 168 hours. No results for input(s): AMMONIA in the last 168 hours. CBC: Recent Labs  Lab 09/11/18 1607 09/12/18 0849 09/13/18 0545  WBC 9.3 10.4 16.0*  NEUTROABS 7.8*  --   --   HGB 12.1*  10.9* 9.3*  HCT 36.0* 33.1* 28.0*  MCV 100.3* 101.2* 101.8*  PLT 126* 108* 111*   Cardiac Enzymes: No results for input(s): CKTOTAL, CKMB, CKMBINDEX, TROPONINI in the last 168 hours. CBG (last 3)  Recent Labs    09/12/18 2128 09/13/18 0732 09/13/18 1106  GLUCAP 144* 143* 147*   Recent Results (from the past 240 hour(s))  SARS Coronavirus 2 (CEPHEID - Performed in [place] hospital lab), Hosp Order     Status: None   Collection Time: 09/11/18  3:57 PM   Specimen: Nasopharyngeal Swab  Result Value Ref Range Status   SARS Coronavirus 2 NEGATIVE NEGATIVE Final    Comment: (NOTE) If result is NEGATIVE SARS-CoV-2 target nucleic acids are NOT  DETECTED. The SARS-CoV-2 RNA is generally detectable in upper and lower  respiratory specimens during the acute phase of infection. The lowest  concentration of SARS-CoV-2 viral copies this assay can detect is 250  copies / mL. A negative result does not preclude SARS-CoV-2 infection  and should not be used as the sole basis for treatment or other  patient management decisions.  A negative result may occur with  improper specimen collection / handling, submission of specimen other  than nasopharyngeal swab, presence of viral mutation(s) within the  areas targeted by this assay, and inadequate number of viral copies  (<250 copies / mL). A negative result must be combined with clinical  observations, patient history, and epidemiological information. If result is POSITIVE SARS-CoV-2 target nucleic acids are DETECTED. The SARS-CoV-2 RNA is generally detectable in upper and lower  respiratory specimens dur ing the acute phase of infection.  Positive  results are indicative of active infection with SARS-CoV-2.  Clinical  correlation with patient history and other diagnostic information is  necessary to determine patient infection status.  Positive results do  not rule out bacterial infection or co-infection with other viruses. If result is PRESUMPTIVE POSTIVE SARS-CoV-2 nucleic acids MAY BE PRESENT.   A presumptive positive result was obtained on the submitted specimen  and confirmed on repeat testing.  While 2019 novel coronavirus  (SARS-CoV-2) nucleic acids may be present in the submitted sample  additional confirmatory testing may be necessary for epidemiological  and / or clinical management purposes  to differentiate between  SARS-CoV-2 and other Sarbecovirus currently known to infect humans.  If clinically indicated additional testing with an alternate test  methodology [id number]) is advised. The SARS-CoV-2 RNA is generally  detectable in upper and lower respiratory sp ecimens during  the acute  phase of infection. The expected result is Negative. Fact Sheet for Patients:  StrictlyIdeas.no Fact Sheet for Healthcare Providers: BankingDealers.co.za This test is not yet approved or cleared by the [place] FDA and has been authorized for detection and/or diagnosis of SARS-CoV-2 by FDA under an Emergency Use Authorization (EUA).  This EUA will remain in effect (meaning this test can be used) for the duration of the COVID-19 declaration under Section 564(b)(1) of the Act, 21 U.S.C. section 360bbb-3(b)(1), unless the authorization is terminated or revoked sooner. Performed at [facility], [address], [place], [place] [address]   Surgical PCR screen     Status: None   Collection Time: 09/11/18 10:50 PM   Specimen: Nasal Mucosa; Nasal Swab  Result Value Ref Range Status   MRSA, PCR NEGATIVE NEGATIVE Final   Staphylococcus aureus NEGATIVE NEGATIVE Final    Comment: (NOTE) The Xpert SA Assay (FDA approved for NASAL specimens in patients 106 years of age and older),  is one component of a comprehensive surveillance program. It is not intended to diagnose infection nor to guide or monitor treatment. Performed at [facility], [address] [place], [place] [address]      Studies: Dg Chest 1 View  Result Date: 09/11/2018 CLINICAL DATA:  Pre operative respiratory exam.  Left hip fracture. EXAM: CHEST  1 VIEW COMPARISON:  None. FINDINGS: The heart size and mediastinal contours are within normal limits. Both lungs are clear. The visualized skeletal structures are unremarkable. IMPRESSION: No active disease. Electronically Signed   By: Francene BoyersJames  Maxwell M.D.   On: 09/11/2018 16:43   Ct Head Wo Contrast  Result Date: 09/11/2018 CLINICAL DATA:  Fall. EXAM: CT HEAD WITHOUT CONTRAST TECHNIQUE: Contiguous axial images were obtained from the base of the skull through the vertex without intravenous contrast. COMPARISON:  Head MRI  08/14/2005 FINDINGS: Brain: There is no evidence of acute infarct, intracranial hemorrhage, mass, midline shift, or extra-axial fluid collection. Patchy to confluent hypodensities in the cerebral white matter bilaterally are nonspecific but compatible with moderate chronic small vessel ischemic disease, progressive from the 2007 MRI. There is mild cerebral atrophy. Focal encephalomalacia inferiorly in the left temporal lobe is new from 2007. Vascular: Calcified atherosclerosis at the skull base. No hyperdense vessel. Skull: No acute fracture. Chronic postoperative changes involving the frontal skull including frontal sinus. Sinuses/Orbits: No evidence of acute sinus disease. Bilateral cataract extraction. Other: None. IMPRESSION: 1. No evidence of acute intracranial abnormality. 2. Moderate chronic small vessel ischemic disease. 3. Focal encephalomalacia in the left temporal lobe, new from 2007 and possibly reflective of old trauma. Electronically Signed   By: Sebastian AcheAllen  Grady M.D.   On: 09/11/2018 19:03   Dg Hip Operative Unilat With Pelvis Left  Result Date: 09/12/2018 CLINICAL DATA:  Portable fluoroscopic imaging for left hip ORIF. EXAM: OPERATIVE LEFT HIP (WITH PELVIS IF PERFORMED) 9 VIEWS TECHNIQUE: Fluoroscopic spot image(s) were submitted for interpretation post-operatively. COMPARISON:  None. FINDINGS: Submitted views show placement of a lateral fixation plate supporting a compression screw, reducing the intertrochanteric primary fracture components into near anatomic alignment. No evidence of an operative complication. IMPRESSION: Well aligned proximal left femur fracture following ORIF. Electronically Signed   By: Amie Portlandavid  Ormond M.D.   On: 09/12/2018 15:17   Dg Hip Unilat W Or Wo Pelvis 2-3 Views Left  Result Date: 09/11/2018 CLINICAL DATA:  Left hip pain secondary to a fall today. EXAM: DG HIP (WITH OR WITHOUT PELVIS) 2-3V LEFT COMPARISON:  None. FINDINGS: There is a comminuted intertrochanteric  fracture proximal left femur with slight distraction. Diffuse osteopenia. Superolateral joint space narrowing at the left hip. Pelvic bones are intact. IMPRESSION: Comminuted intertrochanteric fracture of the proximal left femur. Electronically Signed   By: Francene BoyersJames  Maxwell M.D.   On: 09/11/2018 16:42     Scheduled Meds: . sodium chloride   Intravenous Once  . diltiazem  30 mg Oral Q12H  . dipyridamole-aspirin  1 capsule Oral BID  . docusate sodium  100 mg Oral BID  . folic acid  1 mg Oral Daily  . insulin aspart  0-9 Units Subcutaneous TID WC  . lisinopril  5 mg Oral Daily  . mupirocin ointment  1 application Nasal BID  . traMADol  50 mg Oral Q6H   Continuous Infusions: . sodium chloride 75 mL/hr (09/13/18 0623)  . methocarbamol (ROBAXIN) IV      Principal Problem:   Displaced intertrochanteric fracture of right femur, initial encounter for closed fracture [facility](HCC) Active Problems:  Hypertension   Diabetes mellitus   History of Stroke   Shannon Bienenstockawood Ravon Mortellaro, MD [facility] 09/13/2018, 12:04 PM    LOS: 2 days

## 2018-09-13 NOTE — Plan of Care (Signed)
  Problem: Acute Rehab PT Goals(only PT should resolve) Goal: Pt Will Go Supine/Side To Sit Outcome: Progressing Flowsheets (Taken 09/13/2018 0852) Pt will go Supine/Side to Sit:  with minimal assist  with moderate assist Goal: Patient Will Transfer Sit To/From Stand Outcome: Progressing Flowsheets (Taken 09/13/2018 [phone]) Patient will transfer sit to/from stand: with minimal assist Goal: Pt Will Transfer Bed To Chair/Chair To Bed Outcome: Progressing Flowsheets (Taken 09/13/2018 0852) Pt will Transfer Bed to Chair/Chair to Bed:  with min assist  with mod assist Goal: Pt Will Ambulate Outcome: Progressing Flowsheets (Taken 09/13/2018 0852) Pt will Ambulate:  25 feet  with minimal assist  with moderate assist  with rolling walker   8:52 AM, 09/13/18 Lonell Grandchild, MPT Physical Therapist with [facility], [place] [phone] office [phone] mobile phone

## 2018-09-13 NOTE — Evaluation (Signed)
Physical Therapy Evaluation Patient Details Name: Shannon R Gugliotta Sr. MRN: [id number] [id number] DOB: August 20, 1933 Today's Date: 09/13/2018   History of Present Illness  (P) Shannon CaulJames R Micallef Sr. is a 85 y.o. male s/p Left hip ORIF,  09/12/18 secondary to fall with medical history significant for remote stroke, hypertension, diabetes mellitus, who was brought to the ED after patient fell today.  Patient appears slightly confused-but per care everywhere on his visits, he is sometimes confused, but he is able to answer a few questions appropriately. Patient tells me he was trying to get out of his friend's car, when he fell.  He says he might have slipped.  He tells me he hit his head a bit.  He denies any chest pain or difficulty breathing.  Denies dizziness, cough, fever or chills, vomiting or loose stools.    Clinical Impression  Patient agreeable for therapy and demonstrates slow labored movement requiring much time to sit up at bedside with difficulty propping up on elbows and scooting self forward due to weakness and increased left hip pain with movement.  Patient frequently leaning/falling to the right to avoid putting pressure on left hip while seated at bedside, required bed raised to complete sit to stands and limited to a few unsteady labored steps requiring tactile assistance to help advance LLE during transfer to chair.  Patient tolerated sitting up in chair after therapy - RN notified.  Patient will benefit from continued physical therapy in hospital and recommended venue below to increase strength, balance, endurance for safe ADLs and gait.    Follow Up Recommendations SNF    Equipment Recommendations  None recommended by PT    Recommendations for Other Services       Precautions / Restrictions Precautions Precautions: (P) Fall Restrictions Weight Bearing Restrictions: (P) Yes LLE Weight Bearing: (P) Weight bearing as tolerated      Mobility  Bed Mobility Overal bed mobility: Needs  Assistance Bed Mobility: Supine to Sit     Supine to sit: Mod assist;Max assist     General bed mobility comments: increased time, labored movement, had difficulty propping up on elbows due to weakness, requires assistance to move LLE  Transfers Overall transfer level: Needs assistance Equipment used: Rolling walker (2 wheeled) Transfers: Sit to/from [employer]Stand;Stand Pivot Transfers Sit to Stand: Mod assist Stand pivot transfers: Mod assist;Max assist       General transfer comment: requires assistance to help move LLE when standing  Ambulation/Gait Ambulation/Gait assistance: Mod assist;Max assist Gait Distance (Feet): 4 Feet Assistive device: Rolling walker (2 wheeled) Gait Pattern/deviations: Decreased step length - right;Decreased step length - left;Decreased stance time - left;Decreased stride length;Antalgic Gait velocity: slow   General Gait Details: limited to 5-6 slow unsteady side steps at bedside due to poor standing balance, increased left hip pain, difficulty advancing LLE  Stairs            Wheelchair Mobility    Modified Rankin (Stroke Patients Only)       Balance Overall balance assessment: Needs assistance Sitting-balance support: Feet supported;Bilateral upper extremity supported Sitting balance-Leahy Scale: Poor Sitting balance - Comments: frequent leaning to the right while seated at bedside Postural control: Right lateral lean Standing balance support: During functional activity;Bilateral upper extremity supported Standing balance-Leahy Scale: Poor Standing balance comment: fair/poor using RW                             Pertinent Vitals/Pain Pain Assessment: (P) Faces  Faces Pain Scale: (P) Hurts even more Pain Location: (P) left hip with movement Pain Descriptors / Indicators: (P) Grimacing;Sore Pain Intervention(s): (P) Limited activity within patient's tolerance;Monitored during session;Premedicated before  session;Repositioned;Ice applied    Home Living Family/patient expects to be discharged to:: (P) Private residence Living Arrangements: (P) Alone Available Help at Discharge: (P) Family;Available PRN/intermittently Type of Home: (P) Apartment Home Access: (P) Stairs to enter Entrance Stairs-Rails: (P) None Entrance Stairs-Number of Steps: (P) 1 Home Layout: (P) One level Home Equipment: (P) Walker - 4 wheels;Bedside commode;Shower seat - built in;Grab bars - toilet      Prior Function Level of Independence: (P) Independent with assistive device(s)         Comments: (P) household ambulator witthout AD, uses Rollator for longer distances; independent in ADLs     Hand Dominance   Dominant Hand: (P) Right    Extremity/Trunk Assessment   Upper Extremity Assessment Upper Extremity Assessment: (P) Generalized weakness    Lower Extremity Assessment Lower Extremity Assessment: Generalized weakness;LLE deficits/detail;RLE deficits/detail RLE Deficits / Details: grossly 4/5, limited right knee flexion due to stiffness 0-100 degrees LLE Deficits / Details: grossly 3+/5 LLE: Unable to fully assess due to pain LLE Sensation: WNL LLE Coordination: WNL    Cervical / Trunk Assessment Cervical / Trunk Assessment: (P) Normal  Communication   Communication: (P) No difficulties  Cognition Arousal/Alertness: (P) Awake/alert Behavior During Therapy: (P) WFL for tasks assessed/performed Overall Cognitive Status: (P) Within Functional Limits for tasks assessed                                        General Comments      Exercises     Assessment/Plan    PT Assessment Patient needs continued PT services  PT Problem List Decreased strength;Decreased activity tolerance;Decreased balance;Decreased mobility       PT Treatment Interventions Gait training;Stair training;Functional mobility training;Therapeutic activities;Therapeutic exercise;Patient/family education     PT Goals (Current goals can be found in the Care Plan section)  Acute Rehab PT Goals Patient Stated Goal: return home after rehab PT Goal Formulation: With patient Time For Goal Achievement: 09/27/18 Potential to Achieve Goals: Good    Frequency Min 4X/week   Barriers to discharge        Co-evaluation PT/OT/SLP Co-Evaluation/Treatment: Yes Reason for Co-Treatment: (P) Complexity of the patient's impairments (multi-system involvement);For patient/therapist safety;To address functional/ADL transfers PT goals addressed during session: Mobility/safety with mobility;Balance;Proper use of DME OT goals addressed during session: (P) ADL's and self-care       AM-PAC PT "6 Clicks" Mobility  Outcome Measure Help needed turning from your back to your side while in a flat bed without using bedrails?: A Lot Help needed moving from lying on your back to sitting on the side of a flat bed without using bedrails?: A Lot Help needed moving to and from a bed to a chair (including a wheelchair)?: A Lot Help needed standing up from a chair using your arms (e.g., wheelchair or bedside chair)?: A Lot Help needed to walk in hospital room?: A Lot Help needed climbing 3-5 steps with a railing? : Total 6 Click Score: 11    End of Session   Activity Tolerance: Patient tolerated treatment well;Patient limited by fatigue;Patient limited by pain Patient left: in chair;with call bell/phone within reach;with chair alarm set Nurse Communication: Mobility status PT Visit Diagnosis: Unsteadiness on feet (R26.81);Other  abnormalities of gait and mobility (R26.89);Muscle weakness (generalized) (M62.81)    Time: 3403-7096 PT Time Calculation (min) (ACUTE ONLY): 24 min   Charges:   PT Evaluation $PT Eval Moderate Complexity: 1 Mod PT Treatments $Therapeutic Activity: 23-37 mins        8:51 AM, 09/13/18 Lonell Grandchild, MPT Physical Therapist with [facility] [phone]  office [phone] mobile phone

## 2018-09-13 NOTE — Progress Notes (Signed)
Patient at the end of the bed stating he needed to get up to turn the lights off and shut the door.  Explained that it wasn't safe for him to get up at this time because PT hadn't seen him and gave [place] recommendations on how to safely allow him to move and walk.

## 2018-09-13 NOTE — Addendum Note (Signed)
Addendum  created 09/13/18 0910 by Mickel Baas, CRNA   Clinical Note Signed

## 2018-09-13 NOTE — Progress Notes (Signed)
ANTICOAGULATION CONSULT NOTE - Initial Consult  Pharmacy Consult for Apixaban Indication: atrial fibrillation  No Known Allergies  Patient Measurements: Height: 6\' 2"  (188 cm) Weight: 167 lb 8.8 oz (76 kg) IBW/kg (Calculated) : 82.2  Vital Signs: Temp: 98.6 F (37 C) (07/29 1033) Temp Source: Oral (07/29 1033) BP: 101/62 (07/29 1033) Pulse Rate: 108 (07/29 0247)  Labs: Recent Labs    09/11/18 1607 09/12/18 0849 09/13/18 0545  HGB 12.1* 10.9* 9.3*  HCT 36.0* 33.1* 28.0*  PLT 126* 108* 111*  CREATININE 1.17 1.05 1.15    Estimated Creatinine Clearance: 52.3 mL/min (by C-G formula based on SCr of 1.15 mg/dL).   Medical History: Past Medical History:  Diagnosis Date  . Arthritis   . Diabetes mellitus    diet controlled  . Hypertension   . Stroke [facility])    no deficits    Medications:  Medications Prior to Admission  Medication Sig Dispense Refill Last Dose  . dipyridamole-aspirin (AGGRENOX) 200-25 MG per 12 hr capsule Take 1 capsule by mouth 2 (two) times daily. Morning & supper   09/11/2018 at 700  . Folic Acid-Vit V4-[id number] J19 (FOLBEE PO) Take 1 tablet by mouth daily.   09/11/2018 at Unknown time  . glipiZIDE (GLUCOTROL XL) 2.5 MG 24 hr tablet Take 2.5 mg by mouth daily with breakfast.   09/11/2018 at Unknown time  . lisinopril (ZESTRIL) 10 MG tablet TAKE ONE TABLET (10 MG DOSE) BY MOUTH DAILY.   09/11/2018 at Unknown time    Assessment: 85 year old male with history of stroke, hypertension, type 2 diabetes mellitus brought in to ED after a fall, and  has developed new onset flutter/A. fib . Pharmacy asked to start eliquis. Patient is post left hip repair done 7/28.   Goal of Therapy:  Monitor platelets by anticoagulation protocol: Yes   Plan:  Eliquis 5mg  po bid Educate on eliquis Monitor for S/S of bleeding  Isac Sarna, BS [place], BCPS Clinical Pharmacist Pager [phone] 09/13/2018,12:37 PM

## 2018-09-13 NOTE — Progress Notes (Signed)
Dr. Darrick Meigs in to see patient.  EKG done.  Pt is on telemetry.  Reviewed both EKGs.  Patient is in no distress.  No chest pain, discomfort or tightness, no shortness of breath, sweating, nausea or vomiting noted.  Patient's rate was originally 140s and EKG showed STEMI.  Rate went down considerably while printing EKG.  I performed another which showed A flutter.  Patient is asymptomatic.  Cardizem PO ordered to be given. Will continue to monitor.

## 2018-09-14 DIAGNOSIS — I480 Paroxysmal atrial fibrillation: Secondary | ICD-10-CM

## 2018-09-14 LAB — TYPE AND SCREEN
ABO/RH(D): O NEG
Antibody Screen: NEGATIVE
Unit division: 0
Unit division: 0

## 2018-09-14 LAB — BASIC METABOLIC PANEL
Anion gap: 7 (ref 5–15)
BUN: 35 mg/dL — ABNORMAL HIGH (ref 8–23)
CO2: 24 mmol/L (ref 22–32)
Calcium: 7.7 mg/dL — ABNORMAL LOW (ref 8.9–10.3)
Chloride: 106 mmol/L (ref 98–111)
Creatinine, Ser: 1.31 mg/dL — ABNORMAL HIGH (ref 0.61–1.24)
GFR calc Af Amer: 58 mL/min — ABNORMAL LOW (ref >60)
GFR calc non Af Amer: 50 mL/min — ABNORMAL LOW (ref >60)
Glucose, Bld: 110 mg/dL — ABNORMAL HIGH (ref 70–99)
Potassium: 4.2 mmol/L (ref 3.5–5.1)
Sodium: 137 mmol/L (ref 135–145)

## 2018-09-14 LAB — BPAM RBC
Blood Product Expiration Date: 202008072359
Blood Product Expiration Date: 202008142359
ISSUE DATE / TIME: 202007291451
ISSUE DATE / TIME: 202007301436
Unit Type and Rh: 9500
Unit Type and Rh: 9500

## 2018-09-14 LAB — CBC
HCT: 26.4 % — ABNORMAL LOW (ref 39.0–52.0)
Hemoglobin: 8.7 g/dL — ABNORMAL LOW (ref 13.0–17.0)
MCH: 34.1 pg — ABNORMAL HIGH (ref 26.0–34.0)
MCHC: 33 g/dL (ref 30.0–36.0)
MCV: 103.5 fL — ABNORMAL HIGH (ref 80.0–100.0)
Platelets: 102 10*3/uL — ABNORMAL LOW (ref 150–400)
RBC: 2.55 MIL/uL — ABNORMAL LOW (ref 4.22–5.81)
RDW: 12.9 % (ref 11.5–15.5)
WBC: 12.4 10*3/uL — ABNORMAL HIGH (ref 4.0–10.5)
nRBC: 0 % (ref 0.0–0.2)

## 2018-09-14 LAB — GLUCOSE, CAPILLARY
Glucose-Capillary: 107 mg/dL — ABNORMAL HIGH (ref 70–99)
Glucose-Capillary: 99 mg/dL (ref 70–99)
Glucose-Capillary: 110 mg/dL — ABNORMAL HIGH (ref 70–99)

## 2018-09-14 MED ORDER — TAMSULOSIN HCL 0.4 MG PO CAPS
0.4000 mg | ORAL_CAPSULE | Freq: Every day | ORAL | Status: DC
  Administered 2018-09-14 – 2018-09-19 (×6): 0.4 mg via ORAL
  Filled 2018-09-14 (×7): qty 1

## 2018-09-14 MED ORDER — METOPROLOL TARTRATE 25 MG PO TABS
12.5000 mg | ORAL_TABLET | Freq: Two times a day (BID) | ORAL | Status: DC
  Administered 2018-09-14 – 2018-09-18 (×8): 12.5 mg via ORAL
  Filled 2018-09-14 (×11): qty 1

## 2018-09-14 NOTE — TOC Progression Note (Signed)
Transition of Care (TOC) - Progression Note    Patient Details  Name: Shannon FEDAK Sr. MRN: [id number] Date of Birth: 07/09/1933  Transition of Care [facility]) CM/SW Contact  Boneta Lucks, RN Phone Number: 09/14/2018, 1:19 PM  Clinical Narrative:   Joanna Hews made a bed offer. Spoke with Patient he is refusing. States he does not want to go to rehab. Offered Home health PT, patient also refusing. He states his neighbors will check on him and he has a walker and equipment he needs at home.  CM called [facility] to update them on patient wishes.     Expected Discharge Plan: Home/Self Care Barriers to Discharge: Continued Medical Work up  Expected Discharge Plan and Services Expected Discharge Plan: Home/Self Care   Discharge Planning Services: CM Consult   Expected Discharge Date: 09/14/18

## 2018-09-14 NOTE — Progress Notes (Signed)
PROGRESS NOTE    Shannon CaulJames R Steelman Sr.  ZOX:[id number]  DOB: Apr 30, 1933  DOA: 09/11/2018 PCP: Joette CatchingNyland, Leonard, MD   Brief Admission Hx:  85 year old male with history of stroke, hypertension, type 2 diabetes mellitus brought in to ED after a fall with an acute left hip fracture, status post surgical repair by Dr. [name] 7/28, patient developed a flutter/A. fib on 7/20 9 AM.   Subjective: Patient denies any complaints today, he denies any complaints today, he had urinary retention earlier today .  MDM/Assessment & Plan:   Left hip fracture  -Status post surgical repair by Dr. [name] 7/29  -PT/OT consulted , will need SNF -Continue with PRN pain and nausea medication .  H/o CVA  -He denies any new focal deficits, he is on Aggrenox at home, this was changed to Eliquis during hospital stay in the setting of new onset A. Fib.  New onset A. Fib -Remains in A. fib, heart rate controlled, given soft blood pressure will lower his metoprolol to 12.5 mg p.o. twice daily . - CHA2DS2VASc score is 5.  Will started on Eliquis  Hypertension  -Blood pressure is soft, stopped lisinopril as started on metoprolol for heart rate control   Prolonged QTc  -This recent is 493 on EKG 7/28  DVT prophylaxis: SCDs,  on Eliquis Code Status: Full  Family Communication: Discussed with patient Disposition Plan: Will need SNF placement   Consultants:  Orthopedics Romeo Apple(Harrison)  Procedures:  ORIF left hip fracture 7/28      Objective: Vitals:   09/13/18 1033 09/13/18 1433 09/13/18 2107 09/14/18 0539  BP: 101/62 99/60 (!) 92/58 (!) 91/52  Pulse:   86 93  Resp: 20 20 20 20   Temp: 98.6 F (37 C) 98.7 F (37.1 C) 99.2 F (37.3 C) 99.5 F (37.5 C)  TempSrc: Oral Oral Oral Oral  SpO2: 94% 94% 95% 95%  Weight:      Height:        Intake/Output Summary (Last 24 hours) at 09/14/2018 1151 Last data filed at 09/14/2018 0841 Gross per 24 hour  Intake 240 ml  Output 50 ml  Net 190 ml    Filed Weights   09/11/18 1535 09/11/18 2118 09/12/18 0939  Weight: 77.1 kg 76 kg 76 kg   REVIEW OF SYSTEMS  As per history otherwise all reviewed and reported negative  Exam:  Awake Alert, Oriented X 3, No new F.N deficits, Normal affect Symmetrical Chest wall movement, Good air movement bilaterally, CTAB Irregular irregular,No Gallops,Rubs or new Murmurs, No Parasternal Heave +ve B.Sounds, Abd Soft, No tenderness, No rebound - guarding or rigidity. No Cyanosis, Clubbing or edema, No new Rash or bruise      Data Reviewed: Basic Metabolic Panel: Recent Labs  Lab 09/11/18 1607 09/12/18 0849 09/13/18 0545 09/14/18 0450  NA 139 140 137 137  K 4.1 4.2 4.1 4.2  CL 111 110 107 106  CO2 22 19* 23 24  GLUCOSE 112* 131* 166* 110*  BUN 21 26* 26* 35*  CREATININE 1.17 1.05 1.15 1.31*  CALCIUM 8.9 8.3* 7.7* 7.7*  MG 2.1  --  1.9  --    Liver Function Tests: No results for input(s): AST, ALT, ALKPHOS, BILITOT, PROT, ALBUMIN in the last 168 hours. No results for input(s): LIPASE, AMYLASE in the last 168 hours. No results for input(s): AMMONIA in the last 168 hours. CBC: Recent Labs  Lab 09/11/18 1607 09/12/18 0849 09/13/18 0545 09/14/18 0450  WBC 9.3 10.4 16.0* 12.4*  NEUTROABS 7.8*  --   --   --  HGB 12.1* 10.9* 9.3* 8.7*  HCT 36.0* 33.1* 28.0* 26.4*  MCV 100.3* 101.2* 101.8* 103.5*  PLT 126* 108* 111* 102*   Cardiac Enzymes: No results for input(s): CKTOTAL, CKMB, CKMBINDEX, TROPONINI in the last 168 hours. CBG (last 3)  Recent Labs    09/13/18 1608 09/13/18 2158 09/14/18 0812  GLUCAP 127* 141* 107*   Recent Results (from the past 240 hour(s))  SARS Coronavirus 2 (CEPHEID - Performed in [place] hospital lab), Hosp Order     Status: None   Collection Time: 09/11/18  3:57 PM   Specimen: Nasopharyngeal Swab  Result Value Ref Range Status   SARS Coronavirus 2 NEGATIVE NEGATIVE Final    Comment: (NOTE) If result is NEGATIVE SARS-CoV-2 target nucleic acids  are NOT DETECTED. The SARS-CoV-2 RNA is generally detectable in upper and lower  respiratory specimens during the acute phase of infection. The lowest  concentration of SARS-CoV-2 viral copies this assay can detect is 250  copies / mL. A negative result does not preclude SARS-CoV-2 infection  and should not be used as the sole basis for treatment or other  patient management decisions.  A negative result may occur with  improper specimen collection / handling, submission of specimen other  than nasopharyngeal swab, presence of viral mutation(s) within the  areas targeted by this assay, and inadequate number of viral copies  (<250 copies / mL). A negative result must be combined with clinical  observations, patient history, and epidemiological information. If result is POSITIVE SARS-CoV-2 target nucleic acids are DETECTED. The SARS-CoV-2 RNA is generally detectable in upper and lower  respiratory specimens dur ing the acute phase of infection.  Positive  results are indicative of active infection with SARS-CoV-2.  Clinical  correlation with patient history and other diagnostic information is  necessary to determine patient infection status.  Positive results do  not rule out bacterial infection or co-infection with other viruses. If result is PRESUMPTIVE POSTIVE SARS-CoV-2 nucleic acids MAY BE PRESENT.   A presumptive positive result was obtained on the submitted specimen  and confirmed on repeat testing.  While 2019 novel coronavirus  (SARS-CoV-2) nucleic acids may be present in the submitted sample  additional confirmatory testing may be necessary for epidemiological  and / or clinical management purposes  to differentiate between  SARS-CoV-2 and other Sarbecovirus currently known to infect humans.  If clinically indicated additional testing with an alternate test  methodology [phone]) is advised. The SARS-CoV-2 RNA is generally  detectable in upper and lower respiratory sp ecimens  during the acute  phase of infection. The expected result is Negative. Fact Sheet for Patients:  StrictlyIdeas.no Fact Sheet for Healthcare Providers: BankingDealers.co.za This test is not yet approved or cleared by the [place] FDA and has been authorized for detection and/or diagnosis of SARS-CoV-2 by FDA under an Emergency Use Authorization (EUA).  This EUA will remain in effect (meaning this test can be used) for the duration of the COVID-19 declaration under Section 564(b)(1) of the Act, 21 U.S.C. section 360bbb-3(b)(1), unless the authorization is terminated or revoked sooner. Performed at [facility], [address] [place], [place] [address]   Surgical PCR screen     Status: None   Collection Time: 09/11/18 10:50 PM   Specimen: Nasal Mucosa; Nasal Swab  Result Value Ref Range Status   MRSA, PCR NEGATIVE NEGATIVE Final   Staphylococcus aureus NEGATIVE NEGATIVE Final    Comment: (NOTE) The Xpert SA Assay (FDA approved for NASAL specimens in patients  83 years of age and older), is one component of a comprehensive surveillance program. It is not intended to diagnose infection nor to guide or monitor treatment. Performed at [facility], [address] [place], [place] [address]      Studies: Dg Hip Operative Unilat With Pelvis Left  Result Date: 09/12/2018 CLINICAL DATA:  Portable fluoroscopic imaging for left hip ORIF. EXAM: OPERATIVE LEFT HIP (WITH PELVIS IF PERFORMED) 9 VIEWS TECHNIQUE: Fluoroscopic spot image(s) were submitted for interpretation post-operatively. COMPARISON:  None. FINDINGS: Submitted views show placement of a lateral fixation plate supporting a compression screw, reducing the intertrochanteric primary fracture components into near anatomic alignment. No evidence of an operative complication. IMPRESSION: Well aligned proximal left femur fracture following ORIF. Electronically Signed   By: Amie Portlandavid  Ormond M.D.    On: 09/12/2018 15:17     Scheduled Meds: . sodium chloride   Intravenous Once  . apixaban  5 mg Oral BID  . docusate sodium  100 mg Oral BID  . folic acid  1 mg Oral Daily  . insulin aspart  0-9 Units Subcutaneous TID WC  . metoprolol tartrate  12.5 mg Oral BID  . mupirocin ointment  1 application Nasal BID  . traMADol  50 mg Oral Q6H   Continuous Infusions: . sodium chloride 10 mL/hr at 09/13/18 1359  . methocarbamol (ROBAXIN) IV      Principal Problem:   Displaced intertrochanteric fracture of right femur, initial encounter for closed fracture [facility](HCC) Active Problems:   Hypertension   Diabetes mellitus   History of Stroke   Huey Bienenstockawood Elgergawy, MD [facility] 09/14/2018, 11:51 AM    LOS: 3 days
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
## 2018-09-14 NOTE — Plan of Care (Signed)

## 2018-09-14 NOTE — Progress Notes (Signed)
Bladder scanned pt, noted retaining 319mL. Provider notified no new orders at this time.

## 2018-09-14 NOTE — Progress Notes (Signed)
Patient ID: Shannon Cooke., male   DOB: 1933-07-20, 85 y.o.   MRN: [id number] Postop day 2 status post internal fixation dynamic hip screw for inotrope fracture left hip  BP (!) 91/52 (BP Location: Right Arm)   Pulse 93   Temp 99.5 F (37.5 C) (Oral)   Resp 20   Ht 6\' 2"  (1.88 m)   Wt 76 kg   SpO2 95%   BMI 21.51 kg/m   Blood pressure seems a little low pulse and 93 slight low-grade temperature  Wound looks clean dry and intact with scant drainage without sign of infection  Leg lengths look good rotational alignment is normal  He is mentating well today  CBC Latest Ref Rng & Units 09/13/2018 09/12/2018 09/11/2018  WBC 4.0 - 10.5 K/uL 16.0(H) 10.4 9.3  Hemoglobin 13.0 - 17.0 g/dL 9.3(L) 10.9(L) 12.1(L)  Hematocrit 39.0 - 52.0 % 28.0(L) 33.1(L) 36.0(L)  Platelets 150 - 400 K/uL 111(L) 108(L) 126(L)    BMP Latest Ref Rng & Units 09/14/2018 09/13/2018 09/12/2018  Glucose 70 - 99 mg/dL 110(H) 166(H) 131(H)  BUN 8 - 23 mg/dL 35(H) 26(H) 26(H)  Creatinine 0.61 - 1.24 mg/dL 1.31(H) 1.15 1.05  Sodium 135 - 145 mmol/L 137 137 140  Potassium 3.5 - 5.1 mmol/L 4.2 4.1 4.2  Chloride 98 - 111 mmol/L 106 107 110  CO2 22 - 32 mmol/L 24 23 19(L)  Calcium 8.9 - 10.3 mg/dL 7.7(L) 7.7(L) 8.3(L)    I spoke with his hospitalist and we decided that Eliquis would be okay in place of Aggrenox  PT note demonstrates slow labored movement requiring much time to sit up at bedside with difficulty propping up on elbows and scooting self forward due to weakness and increased left hip pain with movement.  Patient frequently leaning/falling to the right to avoid putting pressure on left hip while seated at bedside, required bed raised to complete sit to stands and limited to a few unsteady labored steps requiring tactile assistance to help advance LLE during transfer to chair.  Patient tolerated sitting up in chair after therapy - RN notified. Patient agreeable for therapy and demonstrates slow labored movement  requiring much time to sit up at bedside with difficulty propping up on elbows and scooting self forward due to weakness and increased left hip pain with movement.  Patient frequently leaning/falling to the right to avoid putting pressure on left hip while seated at bedside, required bed raised to complete sit to stands and limited to a few unsteady labored steps requiring tactile assistance to help advance LLE during transfer to chair.  Patient tolerated sitting up in chair after therapy - RN notified.  Patient will benefit from continued physical therapy in hospital and recommended venue below to increase strength, balance, endurance for safe ADLs and gait.   Postoperative plan Weightbearing as tolerated The patient is on a Eliquis instead of aspirin with dipyridamole Staples out at 2 weeks X-rays that 6 weeks and 12 weeks No hip precautions are needed

## 2018-09-15 DIAGNOSIS — I480 Paroxysmal atrial fibrillation: Secondary | ICD-10-CM

## 2018-09-15 LAB — BASIC METABOLIC PANEL
Anion gap: 5 (ref 5–15)
BUN: 37 mg/dL — ABNORMAL HIGH (ref 8–23)
CO2: 23 mmol/L (ref 22–32)
Calcium: 7.2 mg/dL — ABNORMAL LOW (ref 8.9–10.3)
Chloride: 109 mmol/L (ref 98–111)
Creatinine, Ser: 1.11 mg/dL (ref 0.61–1.24)
GFR calc Af Amer: >60 mL/min (ref >60)
GFR calc non Af Amer: >60 mL/min (ref >60)
Glucose, Bld: 106 mg/dL — ABNORMAL HIGH (ref 70–99)
Potassium: 3.7 mmol/L (ref 3.5–5.1)
Sodium: 137 mmol/L (ref 135–145)

## 2018-09-15 LAB — CBC
HCT: 23.1 % — ABNORMAL LOW (ref 39.0–52.0)
Hemoglobin: 7.7 g/dL — ABNORMAL LOW (ref 13.0–17.0)
MCH: 34.1 pg — ABNORMAL HIGH (ref 26.0–34.0)
MCHC: 33.3 g/dL (ref 30.0–36.0)
MCV: 102.2 fL — ABNORMAL HIGH (ref 80.0–100.0)
Platelets: 109 10*3/uL — ABNORMAL LOW (ref 150–400)
RBC: 2.26 MIL/uL — ABNORMAL LOW (ref 4.22–5.81)
RDW: 12.6 % (ref 11.5–15.5)
WBC: 9 10*3/uL (ref 4.0–10.5)
nRBC: 0 % (ref 0.0–0.2)

## 2018-09-15 LAB — GLUCOSE, CAPILLARY
Glucose-Capillary: 106 mg/dL — ABNORMAL HIGH (ref 70–99)
Glucose-Capillary: 110 mg/dL — ABNORMAL HIGH (ref 70–99)
Glucose-Capillary: 105 mg/dL — ABNORMAL HIGH (ref 70–99)
Glucose-Capillary: 160 mg/dL — ABNORMAL HIGH (ref 70–99)

## 2018-09-15 LAB — PREPARE RBC (CROSSMATCH)

## 2018-09-15 MED ORDER — SODIUM CHLORIDE 0.9% IV SOLUTION: Freq: Once | INTRAVENOUS | Status: DC

## 2018-09-15 MED ORDER — MIDODRINE HCL 5 MG PO TABS
5.0000 mg | ORAL_TABLET | Freq: Two times a day (BID) | ORAL | Status: DC
  Administered 2018-09-15 – 2018-09-20 (×11): 5 mg via ORAL
  Filled 2018-09-15 (×11): qty 1

## 2018-09-15 NOTE — Progress Notes (Signed)
Physical Therapy Note  Patient Details  Name: Shannon TAPP Sr. MRN: [id number] Date of Birth: 1933-08-08 Today's Date: 09/15/2018    Treatment held today per nursing [facility]) Currently receiving RBC's, HgB low  Teena Irani, PTA/CLT [phone]    Roseanne Reno B 09/15/2018, 11:54 AM

## 2018-09-15 NOTE — Progress Notes (Signed)
PROGRESS NOTE    Shannon Cooke  XQJ:[id number]  DOB: 1933-04-25  DOA: 09/11/2018 PCP: Dione Housekeeper, MD   Brief Admission Hx:  85 year old male with history of stroke, hypertension, type 2 diabetes mellitus brought in to ED after a fall with an acute left hip fracture, status post surgical repair by Dr. Aline Brochure 7/28, patient developed a flutter/A. fib on 7/20 9 AM.   Subjective: Patient denies any complaints today, he denies any complaints today, no further urinary retention  MDM/Assessment & Plan:   Left hip fracture  -Status post surgical repair by Dr. Aline Brochure 7/29  -PT/OT consulted , will need SNF -Continue with PRN pain and nausea medication .  H/o CVA  -He denies any new focal deficits, he is on Aggrenox at home, this was changed to Eliquis during hospital stay in the setting of new onset A. Fib.  New onset A. Fib -Remains in A. fib, heart rate controlled, given soft blood pressure will lower his metoprolol to 12.5 mg p.o. twice daily . - CHA2DS2VASc score is 5.  Will started on Eliquis  Hypertension  -Blood pressure is soft, stopped lisinopril as started on metoprolol for heart rate control. -And actually patient with soft blood pressure, I will start him on Midodrin, hopefully this can be stopped in 1 to 2 days  Acute blood loss anemia -Fused 1 unit PRBC today, recheck CBC in a.m.   Prolonged QTc  -This recent is 493 on EKG 7/28  DVT prophylaxis: SCDs,  on Eliquis Code Status: Full  Family Communication: Discussed with patient's daughter via phone today Disposition Plan: Will need SNF placement   Consultants:  Orthopedics Aline Brochure)  Procedures:  ORIF left hip fracture 7/28      Objective: Vitals:   09/15/18 0700 09/15/18 1115 09/15/18 1130 09/15/18 1332  BP: (!) 99/59 (!) 100/56 (!) 98/59 99/61  Pulse: 80 98 87 (!) 56  Resp: 18 16 16 16   Temp: 98 F (36.7 C) 97.9 F (36.6 C) 97.7 F (36.5 C) 98.2 F (36.8 C)  TempSrc:  Oral Oral  Oral  SpO2: 98%  98% 98%  Weight:      Height:        Intake/Output Summary (Last 24 hours) at 09/15/2018 1410 Last data filed at 09/15/2018 1330 Gross per 24 hour  Intake 380 ml  Output -  Net 380 ml   Filed Weights   09/11/18 1535 09/11/18 2118 09/12/18 0939  Weight: 77.1 kg 76 kg 76 kg   REVIEW OF SYSTEMS  As per history otherwise all reviewed and reported negative  Exam:  Awake Alert, Oriented X 2, mildly confused, No new F.N deficits, thin appearing, frail Symmetrical Chest wall movement, Good air movement bilaterally, CTAB IRR IRR,No Gallops,Rubs or new Murmurs, No Parasternal Heave +ve B.Sounds, Abd Soft, No tenderness, No rebound - guarding or rigidity. No Cyanosis, Clubbing or edema, no significant ecchymosis or bruising around left hip incision site, only minimal amount of blood can be seen through dressing    Data Reviewed: Basic Metabolic Panel: Recent Labs  Lab 09/11/18 1607 09/12/18 0849 09/13/18 0545 09/14/18 0450 09/15/18 0445  NA 139 140 137 137 137  K 4.1 4.2 4.1 4.2 3.7  CL 111 110 107 106 109  CO2 22 19* 23 24 23   GLUCOSE 112* 131* 166* 110* 106*  BUN 21 26* 26* 35* 37*  CREATININE 1.17 1.05 1.15 1.31* 1.11  CALCIUM 8.9 8.3* 7.7* 7.7* 7.2*  MG 2.1  --  1.9  --   --  Liver Function Tests: No results for input(s): AST, ALT, ALKPHOS, BILITOT, PROT, ALBUMIN in the last 168 hours. No results for input(s): LIPASE, AMYLASE in the last 168 hours. No results for input(s): AMMONIA in the last 168 hours. CBC: Recent Labs  Lab 09/11/18 1607 09/12/18 0849 09/13/18 0545 09/14/18 0450 09/15/18 0445  WBC 9.3 10.4 16.0* 12.4* 9.0  NEUTROABS 7.8*  --   --   --   --   HGB 12.1* 10.9* 9.3* 8.7* 7.7*  HCT 36.0* 33.1* 28.0* 26.4* 23.1*  MCV 100.3* 101.2* 101.8* 103.5* 102.2*  PLT 126* 108* 111* 102* 109*   Cardiac Enzymes: No results for input(s): CKTOTAL, CKMB, CKMBINDEX, TROPONINI in the last 168 hours. CBG (last 3)  Recent Labs    09/14/18  1623 09/15/18 0756 09/15/18 1138  GLUCAP 110* 106* 110*   Recent Results (from the past 240 hour(s))  SARS Coronavirus 2 (CEPHEID - Performed in [facility]), Hosp Order     Status: None   Collection Time: 09/11/18  3:57 PM   Specimen: Nasopharyngeal Swab  Result Value Ref Range Status   SARS Coronavirus 2 NEGATIVE NEGATIVE Final    Comment: (NOTE) If result is NEGATIVE SARS-CoV-2 target nucleic acids are NOT DETECTED. The SARS-CoV-2 RNA is generally detectable in upper and lower  respiratory specimens during the acute phase of infection. The lowest  concentration of SARS-CoV-2 viral copies this assay can detect is 250  copies / mL. A negative result does not preclude SARS-CoV-2 infection  and should not be used as the sole basis for treatment or other  patient management decisions.  A negative result may occur with  improper specimen collection / handling, submission of specimen other  than nasopharyngeal swab, presence of viral mutation(s) within the  areas targeted by this assay, and inadequate number of viral copies  (<250 copies / mL). A negative result must be combined with clinical  observations, patient history, and epidemiological information. If result is POSITIVE SARS-CoV-2 target nucleic acids are DETECTED. The SARS-CoV-2 RNA is generally detectable in upper and lower  respiratory specimens dur ing the acute phase of infection.  Positive  results are indicative of active infection with SARS-CoV-2.  Clinical  correlation with patient history and other diagnostic information is  necessary to determine patient infection status.  Positive results do  not rule out bacterial infection or co-infection with other viruses. If result is PRESUMPTIVE POSTIVE SARS-CoV-2 nucleic acids MAY BE PRESENT.   A presumptive positive result was obtained on the submitted specimen  and confirmed on repeat testing.  While 2019 novel coronavirus  (SARS-CoV-2) nucleic acids may be  present in the submitted sample  additional confirmatory testing may be necessary for epidemiological  and / or clinical management purposes  to differentiate between  SARS-CoV-2 and other Sarbecovirus currently known to infect humans.  If clinically indicated additional testing with an alternate test  methodology [phone]([id number]) is advised. The SARS-CoV-2 RNA is generally  detectable in upper and lower respiratory sp ecimens during the acute  phase of infection. The expected result is Negative. Fact Sheet for Patients:  BoilerBrush.com.cyhttps://www.fda.gov/media/136312/download Fact Sheet for Healthcare Providers: https://pope.com/https://www.fda.gov/media/136313/download This test is not yet approved or cleared by the [place] FDA and has been authorized for detection and/or diagnosis of SARS-CoV-2 by FDA under an Emergency Use Authorization (EUA).  This EUA will remain in effect (meaning this test can be used) for the duration of the COVID-19 declaration under Section 564(b)(1) of the Act, 21 U.S.C. section  360bbb-3(b)(1), unless the authorization is terminated or revoked sooner. Performed at [facility], [address] [place], [place] [address]   Surgical PCR screen     Status: None   Collection Time: 09/11/18 10:50 PM   Specimen: Nasal Mucosa; Nasal Swab  Result Value Ref Range Status   MRSA, PCR NEGATIVE NEGATIVE Final   Staphylococcus aureus NEGATIVE NEGATIVE Final    Comment: (NOTE) The Xpert SA Assay (FDA approved for NASAL specimens in patients 83 years of age and older), is one component of a comprehensive surveillance program. It is not intended to diagnose infection nor to guide or monitor treatment. Performed at [facility], [address] [place], [place] [address]      Studies: No results found.   Scheduled Meds: . sodium chloride   Intravenous Once  . sodium chloride   Intravenous Once  . apixaban  5 mg Oral BID  . docusate sodium  100 mg Oral BID  . folic acid  1 mg Oral Daily  .  insulin aspart  0-9 Units Subcutaneous TID WC  . metoprolol tartrate  12.5 mg Oral BID  . midodrine  5 mg Oral BID WC  . mupirocin ointment  1 application Nasal BID  . tamsulosin  0.4 mg Oral QPC supper  . traMADol  50 mg Oral Q6H   Continuous Infusions: . sodium chloride 10 mL/hr at 09/13/18 1359  . methocarbamol (ROBAXIN) IV      Principal Problem:   Displaced intertrochanteric fracture of right femur, initial encounter for closed fracture [facility](HCC) Active Problems:   Hypertension   Diabetes mellitus   History of Stroke   AF (paroxysmal atrial fibrillation) (HCC)   Huey Bienenstockawood Jaidyn Kuhl, MD [facility] 09/15/2018, 2:10 PM    LOS: 4 days

## 2018-09-15 NOTE — TOC Progression Note (Signed)
Transition of Care (TOC) - Progression Note    Patient Details  Name: Shannon LAD Sr. MRN: [id number] Date of Birth: [birth year]  Transition of Care [facility]) CM/SW Contact  Ihor Gully, LCSW Phone Number: 09/15/2018, 2:27 PM  Clinical Narrative:    Per attending patient cannot go home due to being deconditioned.  LCSW spoke with patient's daughter, Shannon Cooke, and discussed discharge plan. She stated that she and her brothers would speak with patient about going to short term rehab as there was no way patient could go home in his condition. Discussed that patient is oriented to person, place and situation and he would have to agree to rehab. LCSW spoke with Shannon Cooke at [facility] and requested that [facility] authorization be started. Shannon Cooke agreeable to start authorization today.    Expected Discharge Plan: Home/Self Care Barriers to Discharge: Continued Medical Work up  Expected Discharge Plan and Services Expected Discharge Plan: Home/Self Care   Discharge Planning Services: CM Consult Post Acute Care Choice: [place]   Expected Discharge Date: 09/14/18                                     Social Determinants of Health (SDOH) Interventions    Readmission Risk Interventions No flowsheet data found.

## 2018-09-16 ENCOUNTER — Inpatient Hospital Stay (HOSPITAL_COMMUNITY): Payer: Medicare Other

## 2018-09-16 DIAGNOSIS — R509 Fever, unspecified: Secondary | ICD-10-CM

## 2018-09-16 LAB — BASIC METABOLIC PANEL
Anion gap: 9 (ref 5–15)
BUN: 33 mg/dL — ABNORMAL HIGH (ref 8–23)
CO2: 22 mmol/L (ref 22–32)
Calcium: 7.6 mg/dL — ABNORMAL LOW (ref 8.9–10.3)
Chloride: 108 mmol/L (ref 98–111)
Creatinine, Ser: 0.95 mg/dL (ref 0.61–1.24)
GFR calc Af Amer: >60 mL/min (ref >60)
GFR calc non Af Amer: >60 mL/min (ref >60)
Glucose, Bld: 133 mg/dL — ABNORMAL HIGH (ref 70–99)
Potassium: 3.7 mmol/L (ref 3.5–5.1)
Sodium: 139 mmol/L (ref 135–145)

## 2018-09-16 LAB — URINALYSIS, ROUTINE W REFLEX MICROSCOPIC
Bacteria, UA: NONE SEEN
Bilirubin Urine: NEGATIVE
Glucose, UA: 50 mg/dL — AB
Ketones, ur: NEGATIVE mg/dL
Leukocytes,Ua: NEGATIVE
Nitrite: NEGATIVE
Protein, ur: NEGATIVE mg/dL
Specific Gravity, Urine: 1.02 (ref 1.005–1.030)
pH: 5 (ref 5.0–8.0)

## 2018-09-16 LAB — CBC
HCT: 27.3 % — ABNORMAL LOW (ref 39.0–52.0)
Hemoglobin: 9.1 g/dL — ABNORMAL LOW (ref 13.0–17.0)
MCH: 33 pg (ref 26.0–34.0)
MCHC: 33.3 g/dL (ref 30.0–36.0)
MCV: 98.9 fL (ref 80.0–100.0)
Platelets: 140 10*3/uL — ABNORMAL LOW (ref 150–400)
RBC: 2.76 MIL/uL — ABNORMAL LOW (ref 4.22–5.81)
RDW: 14.9 % (ref 11.5–15.5)
WBC: 8.9 10*3/uL (ref 4.0–10.5)
nRBC: 0 % (ref 0.0–0.2)

## 2018-09-16 LAB — GLUCOSE, CAPILLARY
Glucose-Capillary: 138 mg/dL — ABNORMAL HIGH (ref 70–99)
Glucose-Capillary: 150 mg/dL — ABNORMAL HIGH (ref 70–99)
Glucose-Capillary: 199 mg/dL — ABNORMAL HIGH (ref 70–99)
Glucose-Capillary: 126 mg/dL — ABNORMAL HIGH (ref 70–99)

## 2018-09-16 MED ORDER — ENSURE ENLIVE PO LIQD
237.0000 mL | Freq: Two times a day (BID) | ORAL | Status: DC
  Administered 2018-09-16 – 2018-09-20 (×9): 237 mL via ORAL

## 2018-09-16 NOTE — Progress Notes (Signed)
Ambulated very short distance with much coaxing and very small steps.  Went from bed to chair.  Now sitting up in chair.  Dressing to hip dry and intact,

## 2018-09-16 NOTE — Progress Notes (Addendum)
PROGRESS NOTE    Shannon CaulJames R Vigna Sr.  ZOX:[id number]  DOB: 11-11-1933  DOA: 09/11/2018 PCP: Joette CatchingNyland, Leonard, MD   Brief Admission Hx:  85 year old male with history of stroke, hypertension, type 2 diabetes mellitus brought in to ED after a fall with an acute left hip fracture, status post surgical repair by Dr. [name] 7/28, patient developed a flutter/A. fib on 7/20 9 AM.   Subjective: Patient denies any complaints today, no significant events overnight as discussed with staff, patient T-max 100.1 over last 24 hours  MDM/Assessment & Plan:   Left hip fracture  -Status post surgical repair by Dr. [name] 7/29  -PT/OT consulted , will need SNF -Continue with PRN pain and nausea medication .  H/o CVA  -He denies any new focal deficits, he is on Aggrenox at home, this was changed to Eliquis during hospital stay in the setting of new onset A. Fib.  Fever -Patient with low-grade fever at 100.1, nontoxic-appearing, will check chest x-ray to rule out atelectasis, encouraged use incentive spirometry, will check urinalysis given presence of some retention recently.  New onset A. Fib -Remains in A. fib, heart rate controlled, given soft blood pressure , lowered his metoprolol to 12.5 mg p.o. twice daily . - CHA2DS2VASc score is 5.  started on Eliquis  Hypertension  -Blood pressure is soft, stopped lisinopril as started on metoprolol for heart rate control. -And actually patient with soft blood pressure, blood pressure has improved after starting midodrine.  Acute blood loss anemia -Postoperative, transfuse 1 unit PRBC 7/31, hemoglobin improved to 9.1 today   Prolonged QTc  -This recent is 493 on EKG 7/28  DVT prophylaxis: SCDs,  on Eliquis Code Status: Full  Family Communication: Discussed with patient's daughter via phone 7/31 Disposition Plan: Will need SNF placement, awaiting insurance approval   Consultants:  Orthopedics Romeo Apple(Harrison)  Procedures:  ORIF left hip  fracture 7/28      Objective: Vitals:   09/15/18 2010 09/15/18 2158 09/16/18 0645 09/16/18 0700  BP:  102/62 (!) 114/59 120/69  Pulse:  84 98   Resp:   17   Temp:  100.1 F (37.8 C) 100 F (37.8 C) 99.9 F (37.7 C)  TempSrc:  Oral Oral   SpO2: 92% 96% 95%   Weight:      Height:        Intake/Output Summary (Last 24 hours) at 09/16/2018 1120 Last data filed at 09/16/2018 0900 Gross per 24 hour  Intake 620 ml  Output 600 ml  Net 20 ml   Filed Weights   09/11/18 1535 09/11/18 2118 09/12/18 0939  Weight: 77.1 kg 76 kg 76 kg   REVIEW OF SYSTEMS  As per history otherwise all reviewed and reported negative  Exam:  Awake Alert, Oriented X 3, No new F.N deficits, Normal affect, more appropriate and coherent today Symmetrical Chest wall movement, Good air movement bilaterally, CTAB RRR,No Gallops,Rubs or new Murmurs, No Parasternal Heave +ve B.Sounds, Abd Soft, No tenderness, No rebound - guarding or rigidity. No Cyanosis, Clubbing or edema, No new Rash or bruise, patient with mild surrounding ecchymosis at surgical site, minimal  Data Reviewed: Basic Metabolic Panel: Recent Labs  Lab 09/11/18 1607 09/12/18 0849 09/13/18 0545 09/14/18 0450 09/15/18 0445 09/16/18 0602  NA 139 140 137 137 137 139  K 4.1 4.2 4.1 4.2 3.7 3.7  CL 111 110 107 106 109 108  CO2 22 19* 23 24 23 22   GLUCOSE 112* 131* 166* 110* 106* 133*  BUN 21  26* 26* 35* 37* 33*  CREATININE 1.17 1.05 1.15 1.31* 1.11 0.95  CALCIUM 8.9 8.3* 7.7* 7.7* 7.2* 7.6*  MG 2.1  --  1.9  --   --   --    Liver Function Tests: No results for input(s): AST, ALT, ALKPHOS, BILITOT, PROT, ALBUMIN in the last 168 hours. No results for input(s): LIPASE, AMYLASE in the last 168 hours. No results for input(s): AMMONIA in the last 168 hours. CBC: Recent Labs  Lab 09/11/18 1607 09/12/18 0849 09/13/18 0545 09/14/18 0450 09/15/18 0445 09/16/18 0602  WBC 9.3 10.4 16.0* 12.4* 9.0 8.9  NEUTROABS 7.8*  --   --   --   --    --   HGB 12.1* 10.9* 9.3* 8.7* 7.7* 9.1*  HCT 36.0* 33.1* 28.0* 26.4* 23.1* 27.3*  MCV 100.3* 101.2* 101.8* 103.5* 102.2* 98.9  PLT 126* 108* 111* 102* 109* 140*   Cardiac Enzymes: No results for input(s): CKTOTAL, CKMB, CKMBINDEX, TROPONINI in the last 168 hours. CBG (last 3)  Recent Labs    09/15/18 2159 09/16/18 0719 09/16/18 1115  GLUCAP 160* 138* 150*   Recent Results (from the past 240 hour(s))  SARS Coronavirus 2 (CEPHEID - Performed in [place] hospital lab), Hosp Order     Status: None   Collection Time: 09/11/18  3:57 PM   Specimen: Nasopharyngeal Swab  Result Value Ref Range Status   SARS Coronavirus 2 NEGATIVE NEGATIVE Final    Comment: (NOTE) If result is NEGATIVE SARS-CoV-2 target nucleic acids are NOT DETECTED. The SARS-CoV-2 RNA is generally detectable in upper and lower  respiratory specimens during the acute phase of infection. The lowest  concentration of SARS-CoV-2 viral copies this assay can detect is 250  copies / mL. A negative result does not preclude SARS-CoV-2 infection  and should not be used as the sole basis for treatment or other  patient management decisions.  A negative result may occur with  improper specimen collection / handling, submission of specimen other  than nasopharyngeal swab, presence of viral mutation(s) within the  areas targeted by this assay, and inadequate number of viral copies  (<250 copies / mL). A negative result must be combined with clinical  observations, patient history, and epidemiological information. If result is POSITIVE SARS-CoV-2 target nucleic acids are DETECTED. The SARS-CoV-2 RNA is generally detectable in upper and lower  respiratory specimens dur ing the acute phase of infection.  Positive  results are indicative of active infection with SARS-CoV-2.  Clinical  correlation with patient history and other diagnostic information is  necessary to determine patient infection status.  Positive results do  not  rule out bacterial infection or co-infection with other viruses. If result is PRESUMPTIVE POSTIVE SARS-CoV-2 nucleic acids MAY BE PRESENT.   A presumptive positive result was obtained on the submitted specimen  and confirmed on repeat testing.  While 2019 novel coronavirus  (SARS-CoV-2) nucleic acids may be present in the submitted sample  additional confirmatory testing may be necessary for epidemiological  and / or clinical management purposes  to differentiate between  SARS-CoV-2 and other Sarbecovirus currently known to infect humans.  If clinically indicated additional testing with an alternate test  methodology [phone]) is advised. The SARS-CoV-2 RNA is generally  detectable in upper and lower respiratory sp ecimens during the acute  phase of infection. The expected result is Negative. Fact Sheet for Patients:  StrictlyIdeas.no Fact Sheet for Healthcare Providers: BankingDealers.co.za This test is not yet approved or cleared by the [place] FDA and has been authorized for detection and/or diagnosis of SARS-CoV-2 by FDA under an Emergency Use Authorization (EUA).  This EUA will remain in effect (meaning this test can be used) for the duration of the COVID-19 declaration under Section 564(b)(1) of the Act, 21 U.S.C. section 360bbb-3(b)(1), unless the authorization is terminated or revoked sooner. Performed at [facility], [address] [place], [place] [address]   Surgical PCR screen     Status: None   Collection Time: 09/11/18 10:50 PM   Specimen: Nasal Mucosa; Nasal Swab  Result Value Ref Range Status   MRSA, PCR NEGATIVE NEGATIVE Final   Staphylococcus aureus NEGATIVE NEGATIVE Final    Comment: (NOTE) The Xpert SA Assay (FDA approved for NASAL specimens in patients 83 years of age and older), is one component of a comprehensive surveillance program. It is not intended to diagnose infection nor to guide or monitor  treatment. Performed at [facility], [address] [place], [place] [address]      Studies: No results found.   Scheduled Meds: . sodium chloride   Intravenous Once  . sodium chloride   Intravenous Once  . apixaban  5 mg Oral BID  . docusate sodium  100 mg Oral BID  . feeding supplement (ENSURE ENLIVE)  237 mL Oral BID BM  . folic acid  1 mg Oral Daily  . insulin aspart  0-9 Units Subcutaneous TID WC  . metoprolol tartrate  12.5 mg Oral BID  . midodrine  5 mg Oral BID WC  . mupirocin ointment  1 application Nasal BID  . tamsulosin  0.4 mg Oral QPC supper  . traMADol  50 mg Oral Q6H   Continuous Infusions: . sodium chloride 10 mL/hr at 09/13/18 1359  . methocarbamol (ROBAXIN) IV      Principal Problem:   Displaced intertrochanteric fracture of right femur, initial encounter for closed fracture [facility](HCC) Active Problems:   Hypertension   Diabetes mellitus   History of Stroke   AF (paroxysmal atrial fibrillation) (HCC)   Huey Bienenstockawood Annaliesa Blann, MD [facility] 09/16/2018, 11:20 AM    LOS: 5 days

## 2018-09-17 LAB — GLUCOSE, CAPILLARY
Glucose-Capillary: 120 mg/dL — ABNORMAL HIGH (ref 70–99)
Glucose-Capillary: 118 mg/dL — ABNORMAL HIGH (ref 70–99)
Glucose-Capillary: 180 mg/dL — ABNORMAL HIGH (ref 70–99)
Glucose-Capillary: 120 mg/dL — ABNORMAL HIGH (ref 70–99)
Glucose-Capillary: 119 mg/dL — ABNORMAL HIGH (ref 70–99)

## 2018-09-17 LAB — CBC
HCT: 26.5 % — ABNORMAL LOW (ref 39.0–52.0)
Hemoglobin: 8.7 g/dL — ABNORMAL LOW (ref 13.0–17.0)
MCH: 32.5 pg (ref 26.0–34.0)
MCHC: 32.8 g/dL (ref 30.0–36.0)
MCV: 98.9 fL (ref 80.0–100.0)
Platelets: 155 10*3/uL (ref 150–400)
RBC: 2.68 MIL/uL — ABNORMAL LOW (ref 4.22–5.81)
RDW: 14.8 % (ref 11.5–15.5)
WBC: 9.4 10*3/uL (ref 4.0–10.5)
nRBC: 0 % (ref 0.0–0.2)

## 2018-09-17 LAB — TYPE AND SCREEN
ABO/RH(D): O NEG
Antibody Screen: NEGATIVE
Unit division: 0

## 2018-09-17 LAB — BASIC METABOLIC PANEL
Anion gap: 8 (ref 5–15)
BUN: 32 mg/dL — ABNORMAL HIGH (ref 8–23)
CO2: 22 mmol/L (ref 22–32)
Calcium: 7.5 mg/dL — ABNORMAL LOW (ref 8.9–10.3)
Chloride: 109 mmol/L (ref 98–111)
Creatinine, Ser: 0.9 mg/dL (ref 0.61–1.24)
GFR calc Af Amer: >60 mL/min (ref >60)
GFR calc non Af Amer: >60 mL/min (ref >60)
Glucose, Bld: 138 mg/dL — ABNORMAL HIGH (ref 70–99)
Potassium: 3.6 mmol/L (ref 3.5–5.1)
Sodium: 139 mmol/L (ref 135–145)

## 2018-09-17 LAB — BPAM RBC
Blood Product Expiration Date: 202008262359
ISSUE DATE / TIME: 202007311121
Unit Type and Rh: 9500

## 2018-09-17 NOTE — Progress Notes (Addendum)
PROGRESS NOTE    Shannon CaulJames R Cropper Sr.  ZOX:[id number]  DOB: [birth year]  DOA: 09/11/2018 PCP: Joette CatchingNyland, Leonard, MD   Brief Admission Hx:  85 year old male with history of stroke, hypertension, type 2 diabetes mellitus brought in to ED after a fall with an acute left hip fracture, status post surgical repair by Dr. [name] 7/28, patient developed a flutter/A. fib on 7/20 9 AM.   Subjective: Patient denies any complaints today, no significant events overnight as discussed with staff, he is afebrile  MDM/Assessment & Plan:   Left hip fracture  -Status post surgical repair by Dr. [name] 7/29  -PT/OT consulted , will need SNF -Continue with PRN pain and nausea medication .  H/o CVA  -He denies any new focal deficits, he is on Aggrenox at home, this was changed to Eliquis during hospital stay in the setting of new onset A. Fib.  Fever -Patient is afebrile over last 24 hours, he is nontoxic-appearing, chest x-ray significant for atelectasis, he was encouraged again to use incentive spirometry today . -Negative UA .  New onset A. Fib -Remains in A. fib, heart rate controlled, given soft blood pressure , lowered his metoprolol to 12.5 mg p.o. twice daily . - CHA2DS2VASc score is 5.  started on Eliquis  Hypertension  -Blood pressure is soft, stopped lisinopril as started on metoprolol for heart rate control. -And actually patient with soft blood pressure, blood pressure has improved after starting midodrine.  Acute blood loss anemia -Postoperative, transfuse 1 unit PRBC 7/31, hemoglobin remained stable at 8.7 today   Prolonged QTc  -This recent is 493 on EKG 7/28  DVT prophylaxis: SCDs,  on Eliquis Code Status: Full  Family Communication: Discussed with patient's daughter via phone 7/31 Disposition Plan: Will need SNF placement, awaiting insurance approval   Consultants:  Orthopedics Romeo Apple([facility])  Procedures:  ORIF left hip fracture 7/28      Objective: Vitals:   09/16/18 2028 09/16/18 2130 09/17/18 0541 09/17/18 0823  BP:  118/68 92/63 108/72  Pulse:  (!) 105 88 95  Resp:  18 17   Temp:  98.4 F (36.9 C) 98.4 F (36.9 C)   TempSrc:  Oral Oral   SpO2: 99% 98% 98%   Weight:      Height:        Intake/Output Summary (Last 24 hours) at 09/17/2018 1327 Last data filed at 09/17/2018 1238 Gross per 24 hour  Intake 2494.01 ml  Output 1380 ml  Net 1114.01 ml   Filed Weights   09/11/18 1535 09/11/18 2118 09/12/18 0939  Weight: 77.1 kg 76 kg 76 kg   REVIEW OF SYSTEMS  As per history otherwise all reviewed and reported negative  Exam:  Awake Alert, Oriented X 3, No new F.N deficits, frail, thin appearing Symmetrical Chest wall movement, diminished air entry at the bases, CTAB RRR,No Gallops,Rubs or new Murmurs, No Parasternal Heave +ve B.Sounds, Abd Soft, No tenderness, No rebound - guarding or rigidity. No Cyanosis, Clubbing or edema, patient with bruising surrounding bandage at surgical site, it has a slightly extended today, but overall remains minimal  Data Reviewed: Basic Metabolic Panel: Recent Labs  Lab 09/11/18 1607  09/13/18 0545 09/14/18 0450 09/15/18 0445 09/16/18 0602 09/17/18 0609  NA 139   < > 137 137 137 139 139  K 4.1   < > 4.1 4.2 3.7 3.7 3.6  CL 111   < > 107 106 109 108 109  CO2 22   < > 23 24 23  22  22  GLUCOSE 112*   < > 166* 110* 106* 133* 138*  BUN 21   < > 26* 35* 37* 33* 32*  CREATININE 1.17   < > 1.15 1.31* 1.11 0.95 0.90  CALCIUM 8.9   < > 7.7* 7.7* 7.2* 7.6* 7.5*  MG 2.1  --  1.9  --   --   --   --    < > = values in this interval not displayed.   Liver Function Tests: No results for input(s): AST, ALT, ALKPHOS, BILITOT, PROT, ALBUMIN in the last 168 hours. No results for input(s): LIPASE, AMYLASE in the last 168 hours. No results for input(s): AMMONIA in the last 168 hours. CBC: Recent Labs  Lab 09/11/18 1607  09/13/18 0545 09/14/18 0450 09/15/18 0445 09/16/18 0602 09/17/18 0609  WBC 9.3   < >  16.0* 12.4* 9.0 8.9 9.4  NEUTROABS 7.8*  --   --   --   --   --   --   HGB 12.1*   < > 9.3* 8.7* 7.7* 9.1* 8.7*  HCT 36.0*   < > 28.0* 26.4* 23.1* 27.3* 26.5*  MCV 100.3*   < > 101.8* 103.5* 102.2* 98.9 98.9  PLT 126*   < > 111* 102* 109* 140* 155   < > = values in this interval not displayed.   Cardiac Enzymes: No results for input(s): CKTOTAL, CKMB, CKMBINDEX, TROPONINI in the last 168 hours. CBG (last 3)  Recent Labs    09/17/18 0715 09/17/18 0746 09/17/18 1106  GLUCAP 120* 118* 180*   Recent Results (from the past 240 hour(s))  SARS Coronavirus 2 (CEPHEID - Performed in [facility]), Hosp Order     Status: None   Collection Time: 09/11/18  3:57 PM   Specimen: Nasopharyngeal Swab  Result Value Ref Range Status   SARS Coronavirus 2 NEGATIVE NEGATIVE Final    Comment: (NOTE) If result is NEGATIVE SARS-CoV-2 target nucleic acids are NOT DETECTED. The SARS-CoV-2 RNA is generally detectable in upper and lower  respiratory specimens during the acute phase of infection. The lowest  concentration of SARS-CoV-2 viral copies this assay can detect is 250  copies / mL. A negative result does not preclude SARS-CoV-2 infection  and should not be used as the sole basis for treatment or other  patient management decisions.  A negative result may occur with  improper specimen collection / handling, submission of specimen other  than nasopharyngeal swab, presence of viral mutation(s) within the  areas targeted by this assay, and inadequate number of viral copies  (<250 copies / mL). A negative result must be combined with clinical  observations, patient history, and epidemiological information. If result is POSITIVE SARS-CoV-2 target nucleic acids are DETECTED. The SARS-CoV-2 RNA is generally detectable in upper and lower  respiratory specimens dur ing the acute phase of infection.  Positive  results are indicative of active infection with SARS-CoV-2.  Clinical  correlation  with patient history and other diagnostic information is  necessary to determine patient infection status.  Positive results do  not rule out bacterial infection or co-infection with other viruses. If result is PRESUMPTIVE POSTIVE SARS-CoV-2 nucleic acids MAY BE PRESENT.   A presumptive positive result was obtained on the submitted specimen  and confirmed on repeat testing.  While 2019 novel coronavirus  (SARS-CoV-2) nucleic acids may be present in the submitted sample  additional confirmatory testing may be necessary for epidemiological  and / or clinical management purposes  to differentiate between  SARS-CoV-2 and other Sarbecovirus currently known to infect humans.  If clinically indicated additional testing with an alternate test  methodology [phone]) is advised. The SARS-CoV-2 RNA is generally  detectable in upper and lower respiratory sp ecimens during the acute  phase of infection. The expected result is Negative. Fact Sheet for Patients:  StrictlyIdeas.no Fact Sheet for Healthcare Providers: BankingDealers.co.za This test is not yet approved or cleared by the [place] FDA and has been authorized for detection and/or diagnosis of SARS-CoV-2 by FDA under an Emergency Use Authorization (EUA).  This EUA will remain in effect (meaning this test can be used) for the duration of the COVID-19 declaration under Section 564(b)(1) of the Act, 21 U.S.C. section 360bbb-3(b)(1), unless the authorization is terminated or revoked sooner. Performed at [facility], [address], [place], [place] [address]   Surgical PCR screen     Status: None   Collection Time: 09/11/18 10:50 PM   Specimen: Nasal Mucosa; Nasal Swab  Result Value Ref Range Status   MRSA, PCR NEGATIVE NEGATIVE Final   Staphylococcus aureus NEGATIVE NEGATIVE Final    Comment: (NOTE) The Xpert SA Assay (FDA approved for NASAL specimens in patients 76 years of age and  older), is one component of a comprehensive surveillance program. It is not intended to diagnose infection nor to guide or monitor treatment. Performed at [facility], [address], [place], [place] [address]      Studies: Dg Chest [facility] 1 View  Result Date: 09/16/2018 CLINICAL DATA:  Postop fever. EXAM: PORTABLE CHEST 1 VIEW COMPARISON:  09/11/2018 FINDINGS: Cardiac silhouette is normal in size. No mediastinal or hilar masses. Mild opacity at the bases consistent with atelectasis. Lungs are otherwise clear. No convincing pleural effusion. No pneumothorax. Skeletal structures are grossly intact. IMPRESSION: No acute cardiopulmonary disease. Electronically Signed   By: Lajean Manes M.D.   On: 09/16/2018 12:11     Scheduled Meds: . sodium chloride   Intravenous Once  . sodium chloride   Intravenous Once  . apixaban  5 mg Oral BID  . docusate sodium  100 mg Oral BID  . feeding supplement (ENSURE ENLIVE)  237 mL Oral BID BM  . folic acid  1 mg Oral Daily  . insulin aspart  0-9 Units Subcutaneous TID WC  . metoprolol tartrate  12.5 mg Oral BID  . midodrine  5 mg Oral BID WC  . tamsulosin  0.4 mg Oral QPC supper  . traMADol  50 mg Oral Q6H   Continuous Infusions: . sodium chloride 75 mL/hr at 09/17/18 0317  . methocarbamol (ROBAXIN) IV      Principal Problem:   Displaced intertrochanteric fracture of right femur, initial encounter for closed fracture [facility]) Active Problems:   Hypertension   Diabetes mellitus   History of Stroke   AF (paroxysmal atrial fibrillation) ([place])   Phillips Climes, MD [facility] 09/17/2018, 1:27 PM    LOS: 6 days

## 2018-09-18 LAB — BASIC METABOLIC PANEL
Anion gap: 5 (ref 5–15)
BUN: 30 mg/dL — ABNORMAL HIGH (ref 8–23)
CO2: 23 mmol/L (ref 22–32)
Calcium: 7.4 mg/dL — ABNORMAL LOW (ref 8.9–10.3)
Chloride: 113 mmol/L — ABNORMAL HIGH (ref 98–111)
Creatinine, Ser: 0.82 mg/dL (ref 0.61–1.24)
GFR calc Af Amer: >60 mL/min (ref >60)
GFR calc non Af Amer: >60 mL/min (ref >60)
Glucose, Bld: 114 mg/dL — ABNORMAL HIGH (ref 70–99)
Potassium: 3.5 mmol/L (ref 3.5–5.1)
Sodium: 141 mmol/L (ref 135–145)

## 2018-09-18 LAB — CBC
HCT: 24.8 % — ABNORMAL LOW (ref 39.0–52.0)
Hemoglobin: 8.1 g/dL — ABNORMAL LOW (ref 13.0–17.0)
MCH: 32.7 pg (ref 26.0–34.0)
MCHC: 32.7 g/dL (ref 30.0–36.0)
MCV: 100 fL (ref 80.0–100.0)
Platelets: 151 10*3/uL (ref 150–400)
RBC: 2.48 MIL/uL — ABNORMAL LOW (ref 4.22–5.81)
RDW: 15 % (ref 11.5–15.5)
WBC: 7.1 10*3/uL (ref 4.0–10.5)
nRBC: 0 % (ref 0.0–0.2)

## 2018-09-18 LAB — GLUCOSE, CAPILLARY
Glucose-Capillary: 113 mg/dL — ABNORMAL HIGH (ref 70–99)
Glucose-Capillary: 127 mg/dL — ABNORMAL HIGH (ref 70–99)
Glucose-Capillary: 120 mg/dL — ABNORMAL HIGH (ref 70–99)
Glucose-Capillary: 101 mg/dL — ABNORMAL HIGH (ref 70–99)

## 2018-09-18 MED ORDER — FERROUS SULFATE 325 (65 FE) MG PO TABS
325.0000 mg | ORAL_TABLET | Freq: Two times a day (BID) | ORAL | Status: DC
  Administered 2018-09-18 – 2018-09-20 (×4): 325 mg via ORAL
  Filled 2018-09-18 (×4): qty 1

## 2018-09-18 MED ORDER — SENNOSIDES-DOCUSATE SODIUM 8.6-50 MG PO TABS
2.0000 | ORAL_TABLET | Freq: Two times a day (BID) | ORAL | Status: DC
  Administered 2018-09-18 – 2018-09-20 (×5): 2 via ORAL
  Filled 2018-09-18 (×5): qty 2

## 2018-09-18 NOTE — Care Management Important Message (Signed)
Important Message  Patient Details  Name: Shannon R Koral Sr. MRN: [id number] Date of Birth: 12/26/1933   Medicare Important Message Given:  Yes     [name] 09/18/2018, 2:42 PM 

## 2018-09-18 NOTE — Care Management Important Message (Signed)
Important Message  Patient Details  Name: Shannon WEYER Sr. MRN: [id number] Date of Birth: December 27, 1933   Medicare Important Message Given:  Yes     Tommy Medal 09/18/2018, 2:42 PM

## 2018-09-18 NOTE — Progress Notes (Signed)
Informed by central telemetry that patient had a 2 second pause in HR. Patient is currently asymptomatic and denies any chest pain or discomfort. Patient is resting comfortably in bed at this time. MD has been E-Paged. Vital signs are as follows:    09/18/18 1252  Vitals  Temp 99.4 F (37.4 C)  Temp Source Oral  BP Location Right Arm  BP Method Automatic  Patient Position (if appropriate) Lying  Pulse Rate 75  Pulse Rate Source Dinamap  Resp 16  Oxygen Therapy  SpO2 96 %  O2 Device Room Air  Patient Activity (if Appropriate) In bed  Pain Assessment  Pain Scale 0-10  Pain Score 0  MEWS Score  MEWS RR 0  MEWS Pulse 0  MEWS Systolic 0  MEWS LOC 0  MEWS Temp 0  MEWS Score 0  MEWS Score Color Green    Will continue to monitor patient.

## 2018-09-18 NOTE — Progress Notes (Signed)
PROGRESS NOTE    Shannon CaulJames R Griep Cooke.  VHQ:[id number]  DOB: 1933-07-20  DOA: 09/11/2018 PCP: Joette CatchingNyland, Leonard, MD   Brief Admission Hx:  85 year old male with history of stroke, hypertension, type 2 diabetes mellitus brought in to ED after a fall with an acute left hip fracture, status post surgical repair by Dr. [name] 7/28, patient developed a flutter/A. fib on 7/20 9 AM.   Subjective: Patient denies any complaints today, no significant events overnight as discussed with staff, he is afebrile  MDM/Assessment & Plan:   Left hip fracture  -Status post surgical repair by Dr. [name] 7/29  -PT/OT consulted , will need SNF -Continue with PRN pain and nausea medication .  H/o CVA  -He denies any new focal deficits, he is on Aggrenox at home, this was changed to Eliquis during hospital stay in the setting of new onset A. Fib.  Fever -Patient is afebrile over last 48 hours, he is nontoxic-appearing, chest x-ray significant for atelectasis, he was encouraged again to use incentive spirometry today . -Negative UA .  New onset A. Fib -Remains in A. fib, heart rate controlled, given soft blood pressure , lowered his metoprolol to 12.5 mg p.o. twice daily . - CHA2DS2VASc score is 5.  started on Eliquis  Hypertension  -Blood pressure is soft, stopped lisinopril as started on metoprolol for heart rate control. -And actually patient with soft blood pressure, blood pressure has improved after starting midodrine.  Acute blood loss anemia -Postoperative, transfused 1 unit PRBC 7/31, continue to monitor closely   Prolonged QTc  -This recent is 493 on EKG 7/28  DVT prophylaxis: SCDs,  on Eliquis Code Status: Full  Family Communication: Discussed with patient's daughter via phone 7/31 Disposition Plan: Will need SNF placement, awaiting insurance approval   Consultants:  Orthopedics Romeo Apple([facility])  Procedures:  ORIF left hip fracture 7/28      Objective: Vitals:   09/17/18  1301 09/17/18 2055 09/18/18 0544 09/18/18 0940  BP: 112/71 109/66 114/83 105/61  Pulse: 87 87 79 73  Resp: 17 17 16    Temp: 98.4 F (36.9 C) 99 F (37.2 C) 98.8 F (37.1 C)   TempSrc: Oral Oral Oral   SpO2: 98% 97% 98%   Weight:      Height:        Intake/Output Summary (Last 24 hours) at 09/18/2018 1126 Last data filed at 09/18/2018 0807 Gross per 24 hour  Intake 2492.07 ml  Output 700 ml  Net 1792.07 ml   Filed Weights   09/11/18 1535 09/11/18 2118 09/12/18 0939  Weight: 77.1 kg 76 kg 76 kg   REVIEW OF SYSTEMS  As per history otherwise all reviewed and reported negative  Exam:  Awake Alert, Oriented X 3, No new F.N deficits, Normal affect Symmetrical Chest wall movement, Good air movement bilaterally, CTAB RRR,No Gallops,Rubs or new Murmurs, No Parasternal Heave +ve B.Sounds, Abd Soft, No tenderness, No rebound - guarding or rigidity. No Cyanosis, Clubbing or edema, patient with bruising surrounding bandage at surgical site, it has a slightly extended today, but overall remains minimal  Data Reviewed: Basic Metabolic Panel: Recent Labs  Lab 09/11/18 1607  09/13/18 0545 09/14/18 0450 09/15/18 0445 09/16/18 0602 09/17/18 0609 09/18/18 0329  NA 139   < > 137 137 137 139 139 141  K 4.1   < > 4.1 4.2 3.7 3.7 3.6 3.5  CL 111   < > 107 106 109 108 109 113*  CO2 22   < > 23  24 23 22 22 23   GLUCOSE 112*   < > 166* 110* 106* 133* 138* 114*  BUN 21   < > 26* 35* 37* 33* 32* 30*  CREATININE 1.17   < > 1.15 1.31* 1.11 0.95 0.90 0.82  CALCIUM 8.9   < > 7.7* 7.7* 7.2* 7.6* 7.5* 7.4*  MG 2.1  --  1.9  --   --   --   --   --    < > = values in this interval not displayed.   Liver Function Tests: No results for input(s): AST, ALT, ALKPHOS, BILITOT, PROT, ALBUMIN in the last 168 hours. No results for input(s): LIPASE, AMYLASE in the last 168 hours. No results for input(s): AMMONIA in the last 168 hours. CBC: Recent Labs  Lab 09/11/18 1607  09/14/18 0450 09/15/18 0445  09/16/18 0602 09/17/18 0609 09/18/18 0329  WBC 9.3   < > 12.4* 9.0 8.9 9.4 7.1  NEUTROABS 7.8*  --   --   --   --   --   --   HGB 12.1*   < > 8.7* 7.7* 9.1* 8.7* 8.1*  HCT 36.0*   < > 26.4* 23.1* 27.3* 26.5* 24.8*  MCV 100.3*   < > 103.5* 102.2* 98.9 98.9 100.0  PLT 126*   < > 102* 109* 140* 155 151   < > = values in this interval not displayed.   Cardiac Enzymes: No results for input(s): CKTOTAL, CKMB, CKMBINDEX, TROPONINI in the last 168 hours. CBG (last 3)  Recent Labs    09/17/18 2058 09/18/18 0729 09/18/18 1109  GLUCAP 119* 113* 127*   Recent Results (from the past 240 hour(s))  SARS Coronavirus 2 (CEPHEID - Performed in [place] hospital lab), Hosp Order     Status: None   Collection Time: 09/11/18  3:57 PM   Specimen: Nasopharyngeal Swab  Result Value Ref Range Status   SARS Coronavirus 2 NEGATIVE NEGATIVE Final    Comment: (NOTE) If result is NEGATIVE SARS-CoV-2 target nucleic acids are NOT DETECTED. The SARS-CoV-2 RNA is generally detectable in upper and lower  respiratory specimens during the acute phase of infection. The lowest  concentration of SARS-CoV-2 viral copies this assay can detect is 250  copies / mL. A negative result does not preclude SARS-CoV-2 infection  and should not be used as the sole basis for treatment or other  patient management decisions.  A negative result may occur with  improper specimen collection / handling, submission of specimen other  than nasopharyngeal swab, presence of viral mutation(s) within the  areas targeted by this assay, and inadequate number of viral copies  (<250 copies / mL). A negative result must be combined with clinical  observations, patient history, and epidemiological information. If result is POSITIVE SARS-CoV-2 target nucleic acids are DETECTED. The SARS-CoV-2 RNA is generally detectable in upper and lower  respiratory specimens dur ing the acute phase of infection.  Positive  results are indicative of  active infection with SARS-CoV-2.  Clinical  correlation with patient history and other diagnostic information is  necessary to determine patient infection status.  Positive results do  not rule out bacterial infection or co-infection with other viruses. If result is PRESUMPTIVE POSTIVE SARS-CoV-2 nucleic acids MAY BE PRESENT.   A presumptive positive result was obtained on the submitted specimen  and confirmed on repeat testing.  While 2019 novel coronavirus  (SARS-CoV-2) nucleic acids may be present in the submitted sample  additional confirmatory testing may be  necessary for epidemiological  and / or clinical management purposes  to differentiate between  SARS-CoV-2 and other Sarbecovirus currently known to infect humans.  If clinically indicated additional testing with an alternate test  methodology [phone]([id number]) is advised. The SARS-CoV-2 RNA is generally  detectable in upper and lower respiratory sp ecimens during the acute  phase of infection. The expected result is Negative. Fact Sheet for Patients:  BoilerBrush.com.cyhttps://www.fda.gov/media/136312/download Fact Sheet for Healthcare Providers: https://pope.com/https://www.fda.gov/media/136313/download This test is not yet approved or cleared by the [place] FDA and has been authorized for detection and/or diagnosis of SARS-CoV-2 by FDA under an Emergency Use Authorization (EUA).  This EUA will remain in effect (meaning this test can be used) for the duration of the COVID-19 declaration under Section 564(b)(1) of the Act, 21 U.S.C. section 360bbb-3(b)(1), unless the authorization is terminated or revoked sooner. Performed at [facility], [address] [place], [place] [address]   Surgical PCR screen     Status: None   Collection Time: 09/11/18 10:50 PM   Specimen: Nasal Mucosa; Nasal Swab  Result Value Ref Range Status   MRSA, PCR NEGATIVE NEGATIVE Final   Staphylococcus aureus NEGATIVE NEGATIVE Final    Comment: (NOTE) The Xpert SA Assay (FDA approved  for NASAL specimens in patients 222 years of age and older), is one component of a comprehensive surveillance program. It is not intended to diagnose infection nor to guide or monitor treatment. Performed at [facility], [address] [place], [place] [address]      Studies: [facility] 1 View  Result Date: 09/16/2018 CLINICAL DATA:  Postop fever. EXAM: PORTABLE CHEST 1 VIEW COMPARISON:  09/11/2018 FINDINGS: Cardiac silhouette is normal in size. No mediastinal or hilar masses. Mild opacity at the bases consistent with atelectasis. Lungs are otherwise clear. No convincing pleural effusion. No pneumothorax. Skeletal structures are grossly intact. IMPRESSION: No acute cardiopulmonary disease. Electronically Signed   By: Amie Portlandavid  Ormond M.D.   On: 09/16/2018 12:11     Scheduled Meds: . sodium chloride   Intravenous Once  . sodium chloride   Intravenous Once  . apixaban  5 mg Oral BID  . docusate sodium  100 mg Oral BID  . feeding supplement (ENSURE ENLIVE)  237 mL Oral BID BM  . folic acid  1 mg Oral Daily  . insulin aspart  0-9 Units Subcutaneous TID WC  . metoprolol tartrate  12.5 mg Oral BID  . midodrine  5 mg Oral BID WC  . tamsulosin  0.4 mg Oral QPC supper  . traMADol  50 mg Oral Q6H   Continuous Infusions: . sodium chloride 75 mL/hr at 09/18/18 0543  . methocarbamol (ROBAXIN) IV      Principal Problem:   Displaced intertrochanteric fracture of right femur, initial encounter for closed fracture [facility]) Active Problems:   Hypertension   Diabetes mellitus   History of Stroke   AF (paroxysmal atrial fibrillation) ([facility])   Huey Bienenstockawood Ameerah Huffstetler, MD [facility] 09/18/2018, 11:26 AM    LOS: 7 days

## 2018-09-19 ENCOUNTER — Inpatient Hospital Stay (HOSPITAL_COMMUNITY): Payer: Medicare Other

## 2018-09-19 LAB — CBC
HCT: 24.4 % — ABNORMAL LOW (ref 39.0–52.0)
Hemoglobin: 8 g/dL — ABNORMAL LOW (ref 13.0–17.0)
MCH: 33.1 pg (ref 26.0–34.0)
MCHC: 32.8 g/dL (ref 30.0–36.0)
MCV: 100.8 fL — ABNORMAL HIGH (ref 80.0–100.0)
Platelets: 166 10*3/uL (ref 150–400)
RBC: 2.42 MIL/uL — ABNORMAL LOW (ref 4.22–5.81)
RDW: 15 % (ref 11.5–15.5)
WBC: 8 10*3/uL (ref 4.0–10.5)
nRBC: 0 % (ref 0.0–0.2)

## 2018-09-19 LAB — URINALYSIS, ROUTINE W REFLEX MICROSCOPIC
Bilirubin Urine: NEGATIVE
Glucose, UA: NEGATIVE mg/dL
Hgb urine dipstick: NEGATIVE
Ketones, ur: NEGATIVE mg/dL
Leukocytes,Ua: NEGATIVE
Nitrite: NEGATIVE
Protein, ur: NEGATIVE mg/dL
Specific Gravity, Urine: 1.021 (ref 1.005–1.030)
pH: 5 (ref 5.0–8.0)

## 2018-09-19 LAB — GLUCOSE, CAPILLARY
Glucose-Capillary: 91 mg/dL (ref 70–99)
Glucose-Capillary: 157 mg/dL — ABNORMAL HIGH (ref 70–99)
Glucose-Capillary: 115 mg/dL — ABNORMAL HIGH (ref 70–99)
Glucose-Capillary: 101 mg/dL — ABNORMAL HIGH (ref 70–99)

## 2018-09-19 LAB — SARS CORONAVIRUS 2 (TAT 6-24 HRS): SARS Coronavirus 2: NEGATIVE

## 2018-09-19 NOTE — Progress Notes (Addendum)
Notified a. [email or web address]-gosh via amion page system of pt having 2.02 second pause on telemetry monitoring. Pt resting with eyes closed in bed. Will continue to monitor.

## 2018-09-19 NOTE — TOC Progression Note (Signed)
Transition of Care (TOC) - Progression Note    Patient Details  Name: NEHAL SHIVES Sr. MRN: [id number] Date of Birth: 1933/12/27  Transition of Care [facility]) CM/SW Contact  Boneta Lucks, RN Phone Number: 09/19/2018, 10:19 AM  Clinical Narrative:  Spoke with [facility], they are still [employer] authorization. They will follow up today.  They are requesting another COVID test since patient had a fever during the night.  Ask MD to order and updated his POA, which is wanting SNF at [facility].     Barriers to Discharge: Continued Medical Work up  Expected Discharge Plan and Services Expected Discharge Plan: Home/Self Care   Discharge Planning Services: CM Consult Post Acute Care Choice: [place]

## 2018-09-19 NOTE — Progress Notes (Signed)
Physical Therapy Treatment Patient Details Name: Shannon GRUMBINE Sr. MRN: [id number] DOB: 27-Sep-1933 Today's Date: 09/19/2018    History of Present Illness Shannon Cooke. is a 84 y.o. male s/p Left hip ORIF,  09/12/18 secondary to fall with medical history significant for remote stroke, hypertension, diabetes mellitus, who was brought to the ED after patient fell today.  Patient appears slightly confused-but per care everywhere on his visits, he is sometimes confused, but he is able to answer a few questions appropriately. Patient tells me he was trying to get out of his friend's car, when he fell.  He says he might have slipped.  He tells me he hit his head a bit.  He denies any chest pain or difficulty breathing.  Denies dizziness, cough, fever or chills, vomiting or loose stools.    PT Comments    Patient presents seated in chair (assisted by nursing staff) and agreeable for therapy.  Patient requires active assistance to complete most exercises with LLE due to increasing hip pain, requires right foot blocked during sit to stands due to limited right knee flexion, limited to a few steps at bedside mostly due to c/o increased pain when advancing LLE.  Patient requested to go back to bed after therapy.  Patient will benefit from continued physical therapy in hospital and recommended venue below to increase strength, balance, endurance for safe ADLs and gait.    Follow Up Recommendations  SNF     Equipment Recommendations  None recommended by PT    Recommendations for Other Services       Precautions / Restrictions Precautions Precautions: Fall Restrictions Weight Bearing Restrictions: Yes LLE Weight Bearing: Weight bearing as tolerated    Mobility  Bed Mobility Overal bed mobility: Needs Assistance         Sit to supine: Min assist   General bed mobility comments: requires assistance to move LLE when put back to bed  Transfers Overall transfer level: Needs  assistance Equipment used: Rolling walker (2 wheeled) Transfers: Sit to/from [facility] Sit to Stand: Mod assist Stand pivot transfers: Mod assist       General transfer comment: limited right knee flexion, have to block foot for sit to stands from chair  Ambulation/Gait Ambulation/Gait assistance: Mod assist;Max assist Gait Distance (Feet): 5 Feet Assistive device: Rolling walker (2 wheeled) Gait Pattern/deviations: Decreased step length - right;Decreased step length - left;Decreased stance time - left;Decreased stride length;Antalgic Gait velocity: slow   General Gait Details: limited to 5-6 slow labored unsteady side steps with difficulty advancing LLE due to weakness/pain   Stairs             Wheelchair Mobility    Modified Rankin (Stroke Patients Only)       Balance Overall balance assessment: Needs assistance Sitting-balance support: Feet supported Sitting balance-Leahy Scale: Fair Sitting balance - Comments: fair/good seated at bedside   Standing balance support: Bilateral upper extremity supported;During functional activity Standing balance-Leahy Scale: Poor Standing balance comment: fair/poor using RW                            Cognition Arousal/Alertness: Awake/alert Behavior During Therapy: WFL for tasks assessed/performed Overall Cognitive Status: Within Functional Limits for tasks assessed  Exercises General Exercises - Lower Extremity Long Arc Quad: Seated;AROM;Strengthening;Both;10 reps;AAROM Hip Flexion/Marching: Seated;AROM;Strengthening;Both;10 reps;AAROM Toe Raises: Seated;AROM;Strengthening;Both;10 reps Heel Raises: Seated;AAROM;Both;10 reps;Strengthening    General Comments        Pertinent Vitals/Pain Pain Assessment: Faces Faces Pain Scale: Hurts even more Pain Location: left hip with movement Pain Descriptors / Indicators:  Grimacing;Sore;Guarding Pain Intervention(s): Limited activity within patient's tolerance;Monitored during session    Home Living                      Prior Function            PT Goals (current goals can now be found in the care plan section) Acute Rehab PT Goals Patient Stated Goal: return home after rehab PT Goal Formulation: With patient Time For Goal Achievement: 09/27/18 Potential to Achieve Goals: Good Progress towards PT goals: Progressing toward goals    Frequency    Min 4X/week      PT Plan Current plan remains appropriate    Co-evaluation              AM-PAC PT "6 Clicks" Mobility   Outcome Measure  Help needed turning from your back to your side while in a flat bed without using bedrails?: A Lot Help needed moving from lying on your back to sitting on the side of a flat bed without using bedrails?: A Lot Help needed moving to and from a bed to a chair (including a wheelchair)?: A Lot Help needed standing up from a chair using your arms (e.g., wheelchair or bedside chair)?: A Lot Help needed to walk in hospital room?: A Lot Help needed climbing 3-5 steps with a railing? : Total 6 Click Score: 11    End of Session   Activity Tolerance: Patient tolerated treatment well;Patient limited by fatigue;Patient limited by pain Patient left: in bed;with call bell/phone within reach;with bed alarm set Nurse Communication: Mobility status PT Visit Diagnosis: Unsteadiness on feet (R26.81);Other abnormalities of gait and mobility (R26.89);Muscle weakness (generalized) (M62.81)     Time: [id number] PT Time Calculation (min) (ACUTE ONLY): 23 min  Charges:  $Therapeutic Exercise: 8-22 mins $Therapeutic Activity: 8-22 mins                     1:55 PM, 09/19/18 Shannon Cooke, MPT Physical Therapist [facility] [phone] office [phone] mobile phone

## 2018-09-19 NOTE — Progress Notes (Signed)
PROGRESS NOTE    Shannon Cooke  DPO:[id number]  DOB: 06-Dec-1933  DOA: 09/11/2018 PCP: Dione Housekeeper, MD   Brief Admission Hx:  85 year old male with history of stroke, hypertension, type 2 diabetes mellitus brought in to ED after a fall with an acute left hip fracture, status post surgical repair by Dr. Aline Brochure 7/28, patient developed a flutter/A. fib on 7/20 9 AM.  Started on metoprolol, and Eliquis, he did require 1 unit PRBC transfusion.   Subjective: Patient denies any complaints today, had fever 100.7 overnight, 2-second pause on telemetry.  MDM/Assessment & Plan:   Left hip fracture  -Status post surgical repair by Dr. Aline Brochure 7/29  -PT/OT consulted , will need SNF -Continue with PRN pain and nausea medication . -Per orthopedic staples out at 2 weeks from surgery, x-ray at 6 weeks and 12 weeks.  H/o CVA  -He denies any new focal deficits, he is on Aggrenox at home, this was changed to Eliquis during hospital stay in the setting of new onset A. Fib.  Fever -Patient with fever 100.7 overnight, he is with intermittent fever, nontoxic-appearing, repeat chest x-ray to rule out atelectasis, will check UA, CBC showing stable white blood cell count.  New onset A. Fib -Remains in A. fib, heart rate controlled on metoprolol 12.5 mg p.o. twice daily, actually is on the low 50s, as well he did have 2-second pause yesterday, so I will discontinue metoprolol. - CHA2DS2VASc score is 5.  started on Eliquis  Hypertension  -Blood pressure is soft, stopped lisinopril as started on metoprolol for heart rate control(currently stopped). -Continue with midodrine for soft blood pressure . Acute blood loss anemia -Postoperative, transfuse 1 unit PRBC 7/31, neutral hemoglobin closely.   Prolonged QTc  -This recent is 493 on EKG 7/28  DVT prophylaxis: SCDs,  on Eliquis Code Status: Full  Family Communication: Discussed with patient's daughter via phone 7/31 Disposition Plan: Will  need SNF placement, awaiting insurance approval   Consultants:  Orthopedics Aline Brochure)  Procedures:  ORIF left hip fracture 7/28      Objective: Vitals:   09/18/18 1921 09/18/18 2139 09/18/18 2237 09/19/18 0504  BP:  110/63  (!) 91/49  Pulse:  76  62  Resp:  16  16  Temp:  (!) 100.7 F (38.2 C) 99.6 F (37.6 C) 99.2 F (37.3 C)  TempSrc:  Oral Oral Oral  SpO2: 95% 98%  96%  Weight:      Height:        Intake/Output Summary (Last 24 hours) at 09/19/2018 0917 Last data filed at 09/18/2018 2253 Gross per 24 hour  Intake 758.8 ml  Output 500 ml  Net 258.8 ml   Filed Weights   09/11/18 1535 09/11/18 2118 09/12/18 0939  Weight: 77.1 kg 76 kg 76 kg   REVIEW OF SYSTEMS  As per history otherwise all reviewed and reported negative  Exam:  Awake Alert, Oriented X 3, No new F.N deficits, Normal affect, frail, thin appearing, sitting in recliner eating breakfast Symmetrical Chest wall movement, Good air movement bilaterally, CTAB RRR,No Gallops,Rubs or new Murmurs, No Parasternal Heave +ve B.Sounds, Abd Soft, No tenderness, No rebound - guarding or rigidity. No Cyanosis, Clubbing or edema,  patient with bruising surrounding bandage at surgical site, it has a slightly extended today, but overall remains minimal  Data Reviewed: Basic Metabolic Panel: Recent Labs  Lab 09/13/18 0545 09/14/18 0450 09/15/18 0445 09/16/18 0602 09/17/18 0609 09/18/18 0329  NA 137 137 137 139 139 141  K 4.1 4.2 3.7 3.7 3.6 3.5  CL 107 106 109 108 109 113*  CO2 23 24 23 22 22 23   GLUCOSE 166* 110* 106* 133* 138* 114*  BUN 26* 35* 37* 33* 32* 30*  CREATININE 1.15 1.31* 1.11 0.95 0.90 0.82  CALCIUM 7.7* 7.7* 7.2* 7.6* 7.5* 7.4*  MG 1.9  --   --   --   --   --    Liver Function Tests: No results for input(s): AST, ALT, ALKPHOS, BILITOT, PROT, ALBUMIN in the last 168 hours. No results for input(s): LIPASE, AMYLASE in the last 168 hours. No results for input(s): AMMONIA in the last 168  hours. CBC: Recent Labs  Lab 09/15/18 0445 09/16/18 0602 09/17/18 0609 09/18/18 0329 09/19/18 0726  WBC 9.0 8.9 9.4 7.1 8.0  HGB 7.7* 9.1* 8.7* 8.1* 8.0*  HCT 23.1* 27.3* 26.5* 24.8* 24.4*  MCV 102.2* 98.9 98.9 100.0 100.8*  PLT 109* 140* 155 151 166   Cardiac Enzymes: No results for input(s): CKTOTAL, CKMB, CKMBINDEX, TROPONINI in the last 168 hours. CBG (last 3)  Recent Labs    09/18/18 1750 09/18/18 2120 09/19/18 0716  GLUCAP 120* 101* 91   Recent Results (from the past 240 hour(s))  SARS Coronavirus 2 (CEPHEID - Performed in [facility]), Hosp Order     Status: None   Collection Time: 09/11/18  3:57 PM   Specimen: Nasopharyngeal Swab  Result Value Ref Range Status   SARS Coronavirus 2 NEGATIVE NEGATIVE Final    Comment: (NOTE) If result is NEGATIVE SARS-CoV-2 target nucleic acids are NOT DETECTED. The SARS-CoV-2 RNA is generally detectable in upper and lower  respiratory specimens during the acute phase of infection. The lowest  concentration of SARS-CoV-2 viral copies this assay can detect is 250  copies / mL. A negative result does not preclude SARS-CoV-2 infection  and should not be used as the sole basis for treatment or other  patient management decisions.  A negative result may occur with  improper specimen collection / handling, submission of specimen other  than nasopharyngeal swab, presence of viral mutation(s) within the  areas targeted by this assay, and inadequate number of viral copies  (<250 copies / mL). A negative result must be combined with clinical  observations, patient history, and epidemiological information. If result is POSITIVE SARS-CoV-2 target nucleic acids are DETECTED. The SARS-CoV-2 RNA is generally detectable in upper and lower  respiratory specimens dur ing the acute phase of infection.  Positive  results are indicative of active infection with SARS-CoV-2.  Clinical  correlation with patient history and other  diagnostic information is  necessary to determine patient infection status.  Positive results do  not rule out bacterial infection or co-infection with other viruses. If result is PRESUMPTIVE POSTIVE SARS-CoV-2 nucleic acids MAY BE PRESENT.   A presumptive positive result was obtained on the submitted specimen  and confirmed on repeat testing.  While 2019 novel coronavirus  (SARS-CoV-2) nucleic acids may be present in the submitted sample  additional confirmatory testing may be necessary for epidemiological  and / or clinical management purposes  to differentiate between  SARS-CoV-2 and other Sarbecovirus currently known to infect humans.  If clinically indicated additional testing with an alternate test  methodology [phone]([id number]) is advised. The SARS-CoV-2 RNA is generally  detectable in upper and lower respiratory sp ecimens during the acute  phase of infection. The expected result is Negative. Fact Sheet for Patients:  BoilerBrush.com.cyhttps://www.fda.gov/media/136312/download Fact Sheet for Healthcare Providers: https://pope.com/https://www.fda.gov/media/136313/download  This test is not yet approved or cleared by the [place] FDA and has been authorized for detection and/or diagnosis of SARS-CoV-2 by FDA under an Emergency Use Authorization (EUA).  This EUA will remain in effect (meaning this test can be used) for the duration of the COVID-19 declaration under Section 564(b)(1) of the Act, 21 U.S.C. section 360bbb-3(b)(1), unless the authorization is terminated or revoked sooner. Performed at [facility], [address] [place], [place] [address]   Surgical PCR screen     Status: None   Collection Time: 09/11/18 10:50 PM   Specimen: Nasal Mucosa; Nasal Swab  Result Value Ref Range Status   MRSA, PCR NEGATIVE NEGATIVE Final   Staphylococcus aureus NEGATIVE NEGATIVE Final    Comment: (NOTE) The Xpert SA Assay (FDA approved for NASAL specimens in patients 83 years of age and older), is one component of a  comprehensive surveillance program. It is not intended to diagnose infection nor to guide or monitor treatment. Performed at [facility], [address] [place], [place] [address]      Studies: No results found.   Scheduled Meds: . sodium chloride   Intravenous Once  . sodium chloride   Intravenous Once  . apixaban  5 mg Oral BID  . docusate sodium  100 mg Oral BID  . feeding supplement (ENSURE ENLIVE)  237 mL Oral BID BM  . ferrous sulfate  325 mg Oral BID WC  . folic acid  1 mg Oral Daily  . insulin aspart  0-9 Units Subcutaneous TID WC  . metoprolol tartrate  12.5 mg Oral BID  . midodrine  5 mg Oral BID WC  . senna-docusate  2 tablet Oral BID  . tamsulosin  0.4 mg Oral QPC supper  . traMADol  50 mg Oral Q6H   Continuous Infusions: . sodium chloride 75 mL/hr at 09/18/18 2008  . methocarbamol (ROBAXIN) IV      Principal Problem:   Displaced intertrochanteric fracture of right femur, initial encounter for closed fracture [facility](HCC) Active Problems:   Hypertension   Diabetes mellitus   History of Stroke   AF (paroxysmal atrial fibrillation) (HCC)   Huey Bienenstockawood Joandry Slagter, MD [facility] 09/19/2018, 9:17 AM    LOS: 8 days

## 2018-09-20 DIAGNOSIS — Z8781 Personal history of (healed) traumatic fracture: Secondary | ICD-10-CM

## 2018-09-20 DIAGNOSIS — S72002D Fracture of unspecified part of neck of left femur, subsequent encounter for closed fracture with routine healing: Secondary | ICD-10-CM

## 2018-09-20 DIAGNOSIS — I480 Paroxysmal atrial fibrillation: Secondary | ICD-10-CM

## 2018-09-20 DIAGNOSIS — E119 Type 2 diabetes mellitus without complications: Secondary | ICD-10-CM

## 2018-09-20 LAB — GLUCOSE, CAPILLARY
Glucose-Capillary: 86 mg/dL (ref 70–99)
Glucose-Capillary: 102 mg/dL — ABNORMAL HIGH (ref 70–99)
Glucose-Capillary: 134 mg/dL — ABNORMAL HIGH (ref 70–99)

## 2018-09-20 MED ORDER — SENNOSIDES-DOCUSATE SODIUM 8.6-50 MG PO TABS: 2.0000 | ORAL_TABLET | Freq: Two times a day (BID) | ORAL | Status: AC

## 2018-09-20 MED ORDER — FERROUS SULFATE 325 (65 FE) MG PO TABS: 325.0000 mg | ORAL_TABLET | Freq: Two times a day (BID) | ORAL | Status: AC

## 2018-09-20 MED ORDER — DOCUSATE SODIUM 100 MG PO CAPS: 100.0000 mg | ORAL_CAPSULE | Freq: Two times a day (BID) | ORAL | 0 refills | Status: AC

## 2018-09-20 MED ORDER — ENSURE ENLIVE PO LIQD: 237.0000 mL | Freq: Two times a day (BID) | ORAL | Status: AC

## 2018-09-20 MED ORDER — APIXABAN 5 MG PO TABS: 5.0000 mg | ORAL_TABLET | Freq: Two times a day (BID) | ORAL | Status: AC

## 2018-09-20 MED ORDER — MIDODRINE HCL 5 MG PO TABS: 5.0000 mg | ORAL_TABLET | Freq: Two times a day (BID) | ORAL | Status: AC

## 2018-09-20 MED ORDER — TAMSULOSIN HCL 0.4 MG PO CAPS: 0.4000 mg | ORAL_CAPSULE | Freq: Every day | ORAL | Status: AC

## 2018-09-20 MED ORDER — HYDROCODONE-ACETAMINOPHEN 5-325 MG PO TABS: 1.0000 | ORAL_TABLET | Freq: Four times a day (QID) | ORAL | 0 refills | Status: AC | PRN

## 2018-09-20 NOTE — [place] FL2 (Signed)
[place] MEDICAID FL2 LEVEL OF CARE SCREENING TOOL     IDENTIFICATION  Patient Name: Shannon PuntJames R Tash Sr. Birthdate: 1933-06-12 Sex: male Admission Date (Current Location): 09/11/2018  [facility] [place]   Facility and Address:  [facility],  [address], [place] [address]      Provider Number: [id number]  Attending Physician Name and Address:  Vassie LollMadera, Carlos, MD  Relative Name and Phone Number:       Current Level of Care: Hospital Recommended Level of Care: Skilled Nursing Facility Prior Approval Number:    Date Approved/Denied:   PASRR Number: [id number] [id number]  Discharge Plan: SNF    Current Diagnoses: Patient Active Problem List   Diagnosis Date Noted  . AF (paroxysmal atrial fibrillation) (HCC) 09/14/2018  . Hypertension   . Diabetes mellitus   . History of Stroke   . Displaced intertrochanteric fracture of right femur, initial encounter for closed fracture (HCC) 09/11/2018    Orientation RESPIRATION BLADDER Height & Weight     Self, Time, Situation, Place  Normal Continent Weight: 76 kg Height:  6\' 2"  (188 cm)  BEHAVIORAL SYMPTOMS/MOOD NEUROLOGICAL BOWEL NUTRITION STATUS      Continent Diet(see discharge summary)  AMBULATORY STATUS COMMUNICATION OF NEEDS Skin   Extensive Assist Verbally Surgical wounds(left hip)                       Personal Care Assistance Level of Assistance  Bathing, Feeding, Dressing Bathing Assistance: Maximum assistance Feeding assistance: Limited assistance Dressing Assistance: Maximum assistance     Functional Limitations Info  Sight, Hearing, Speech Sight Info: Adequate Hearing Info: Adequate Speech Info: Adequate    SPECIAL CARE FACTORS FREQUENCY  OT (By licensed OT)     PT Frequency: 5 times a week OT Frequency: 3x/week            Contractures Contractures Info: Not present    Additional Factors Info  Code Status, Allergies, Insulin Sliding Scale Code Status Info:  Full Allergies Info: NKDA   Insulin Sliding Scale Info: CBG 70-120 0 units  121-150 1 unit 151-200 2 unit  201-250 3 units 251-300 5 units 301-350 7 unit 351-400 9 units  greater than 400 call MD       Current Medications (09/20/2018):  This is the current hospital active medication list Current Facility-Administered Medications  Medication Dose Route Frequency Provider Last Rate Last Dose  . 0.9 %  sodium chloride infusion (Manually program via Guardrails IV Fluids)   Intravenous Once Vickki HearingHarrison, Stanley E, MD      . 0.9 %  sodium chloride infusion (Manually program via Guardrails IV Fluids)   Intravenous Once Elgergawy, Leana Roeawood S, MD      . 0.9 %  sodium chloride infusion   Intravenous Continuous Vickki HearingHarrison, Stanley E, MD 75 mL/hr at 09/20/18 0400    . acetaminophen (TYLENOL) tablet 650 mg  650 mg Oral Q6H PRN Vickki HearingHarrison, Stanley E, MD   650 mg at 09/18/18 2149   Or  . acetaminophen (TYLENOL) suppository 650 mg  650 mg Rectal Q6H PRN Vickki HearingHarrison, Stanley E, MD      . apixaban Everlene Balls(ELIQUIS) tablet 5 mg  5 mg Oral BID Elgergawy, Leana Roeawood S, MD   5 mg at 09/19/18 2153  . diphenhydrAMINE (BENADRYL) 12.5 MG/5ML elixir 12.5-25 mg  12.5-25 mg Oral Q4H PRN Vickki HearingHarrison, Stanley E, MD      . docusate sodium (COLACE) capsule 100 mg  100 mg Oral BID [name],  Tim Lair, MD   100 mg at 09/19/18 2153  . feeding supplement (ENSURE ENLIVE) (ENSURE ENLIVE) liquid 237 mL  237 mL Oral BID BM Elgergawy, Silver Huguenin, MD   237 mL at 09/19/18 1248  . ferrous sulfate tablet 325 mg  325 mg Oral BID WC Elgergawy, Silver Huguenin, MD   325 mg at 09/20/18 0825  . folic acid (FOLVITE) tablet 1 mg  1 mg Oral Daily Carole Civil, MD   1 mg at 09/19/18 0932  . HYDROcodone-acetaminophen (NORCO/VICODIN) 5-325 MG per tablet 1-2 tablet  1-2 tablet Oral Q4H PRN Carole Civil, MD   1 tablet at 09/16/18 1518  . insulin aspart (novoLOG) injection 0-9 Units  0-9 Units Subcutaneous TID WC Johnson, Clanford L, MD   2 Units at 09/19/18 1248  .  methocarbamol (ROBAXIN) tablet 500 mg  500 mg Oral Q6H PRN Carole Civil, MD       Or  . methocarbamol (ROBAXIN) 500 mg in dextrose 5 % 50 mL IVPB  500 mg Intravenous Q6H PRN Carole Civil, MD      . metoCLOPramide (REGLAN) tablet 5-10 mg  5-10 mg Oral Q8H PRN Carole Civil, MD       Or  . metoCLOPramide (REGLAN) injection 5-10 mg  5-10 mg Intravenous Q8H PRN Carole Civil, MD      . midodrine (PROAMATINE) tablet 5 mg  5 mg Oral BID WC Elgergawy, Silver Huguenin, MD   5 mg at 09/20/18 0825  . ondansetron (ZOFRAN) tablet 4 mg  4 mg Oral Q6H PRN Carole Civil, MD       Or  . ondansetron [facility]) injection 4 mg  4 mg Intravenous Q6H PRN Carole Civil, MD      . senna-docusate (Senokot-S) tablet 2 tablet  2 tablet Oral BID Elgergawy, Silver Huguenin, MD   2 tablet at 09/19/18 2153  . tamsulosin (FLOMAX) capsule 0.4 mg  0.4 mg Oral QPC supper Elgergawy, Silver Huguenin, MD   0.4 mg at 09/19/18 1838  . traMADol (ULTRAM) tablet 50 mg  50 mg Oral Q6H Carole Civil, MD   50 mg at 09/20/18 0609     Discharge Medications: Please see discharge summary for a list of discharge medications.  Relevant Imaging Results:  Relevant Lab Results:   Additional Information SS # [id number]    EX- Wife is POA  Monna-Rae  [phone]  Vianka Ertel, Chauncey Reading, RN

## 2018-09-20 NOTE — TOC Transition Note (Signed)
Transition of Care [facility]) - CM/SW Discharge Note   Patient Details  Name: Shannon LANDERS Sr. MRN: [id number] Date of Birth: October 25, 1933  [facility]) CM/SW Contact:  Alaa Mullally, Chauncey Reading, RN Phone Number: 09/20/2018, 10:16 AM   Clinical Narrative:  Ship broker received. Lynnae Sandhoff of [facility] can accept patient today. Family and patient updated as well as bedside RN and attending. Will send DC clinicals when available.     Final next level of care: Skilled Nursing Facility Barriers to Discharge: Barriers Resolved   Patient Goals and CMS Choice Patient states their goals for this hospitalization and ongoing recovery are:: To go to SNF CMS Medicare.gov Compare Post Acute Care list provided to:: Patient Represenative (must comment)(Monna- Cyndi Bender) Choice offered to / list presented to : [place] / [place]  Discharge Placement              Patient chooses bed at: [facility] Patient to be transferred to facility by: EMS Name of family member notified: Shannon Cooke [facility]) Patient and family notified of of transfer: 09/20/18  Discharge Plan and Services   Discharge Planning Services: CM Consult Post Acute Care Choice: [place]

## 2018-09-20 NOTE — Plan of Care (Signed)
Nsg Discharge Note  Admit Date:  09/11/2018 Discharge date: 09/20/2018   Shannon Fredrickson Sr. to be D/C'd Home per MD order.  AVS completed.  Copy for chart, and copy for patient signed, and dated. Patient/caregiver able to verbalize understanding.  Discharge Medication: Allergies as of 09/20/2018   No Known Allergies      Medication List     STOP taking these medications    dipyridamole-aspirin 200-25 MG 12hr capsule Commonly known as: AGGRENOX   lisinopril 10 MG tablet Commonly known as: ZESTRIL       TAKE these medications    apixaban 5 MG Tabs tablet Commonly known as: ELIQUIS Take 1 tablet (5 mg total) by mouth 2 (two) times daily.   docusate sodium 100 MG capsule Commonly known as: COLACE Take 1 capsule (100 mg total) by mouth 2 (two) times daily.   feeding supplement (ENSURE ENLIVE) Liqd Take 237 mLs by mouth 2 (two) times daily between meals.   ferrous sulfate 325 (65 FE) MG tablet Take 1 tablet (325 mg total) by mouth 2 (two) times daily with a meal.   FOLBEE PO Take 1 tablet by mouth daily.   glipiZIDE 2.5 MG 24 hr tablet Commonly known as: GLUCOTROL XL Take 2.5 mg by mouth daily with breakfast.   HYDROcodone-acetaminophen 5-325 MG tablet Commonly known as: NORCO/VICODIN Take 1-2 tablets by mouth every 6 (six) hours as needed for up to 7 days for severe pain (pain score 4-6).   midodrine 5 MG tablet Commonly known as: PROAMATINE Take 1 tablet (5 mg total) by mouth 2 (two) times daily with a meal.   senna-docusate 8.6-50 MG tablet Commonly known as: Senokot-S Take 2 tablets by mouth 2 (two) times daily.   tamsulosin 0.4 MG Caps capsule Commonly known as: FLOMAX Take 1 capsule (0.4 mg total) by mouth daily after supper.        Discharge Assessment: Vitals:   09/20/18 0605 09/20/18 1352  BP: (!) 100/59 105/64  Pulse: 73 62  Resp: 16 18  Temp: 98.7 F (37.1 C) 98.6 F (37 C)  SpO2: 98% 96%   Skin clean, dry and intact without evidence of  skin break down, no evidence of skin tears noted. IV catheter discontinued intact. Site without signs and symptoms of complications - no redness or edema noted at insertion site, patient denies c/o pain - only slight tenderness at site.  Dressing with slight pressure applied.  D/c Instructions-Education: Discharge instructions given to patient/family with verbalized understanding. D/c education completed with patient/family including follow up instructions, medication list, d/c activities limitations if indicated, with other d/c instructions as indicated by MD - patient able to verbalize understanding, all questions fully answered. Patient instructed to return to ED, call 911, or call MD for any changes in condition.  Patient escorted via [place], and D/C home via private auto.  Dorcas Mcmurray, LPN 04/20/1441 1:54 PM

## 2018-09-20 NOTE — Discharge Summary (Signed)
Physician Discharge Summary  Shannon CaulJames [name] ZOX:[id number] DOB: [birth year] DOA: 09/11/2018  PCP: Joette CatchingNyland, Leonard, MD  Admit date: 09/11/2018 Discharge date: 09/20/2018  Time spent: 35 minutes  Recommendations for Outpatient Follow-up:  1. Repeat basic metabolic panel to follow electrolytes and renal function 2. Follow vital signs and restart antihypertensive medication if needed 3. Repeat CBC to follow hemoglobin trend   Discharge Diagnoses:  Principal Problem:   Closed left hip fracture (HCC) Active Problems:   Hypertension   Diabetes mellitus   History of Stroke   AF (paroxysmal atrial fibrillation) (HCC)   Type 2 diabetes mellitus without complication, without long-term current use of insulin (HCC)   S/p left hip fracture BPH  Discharge Condition: Stable and improved.  Patient discharged to skilled nursing facility for further care and rehabilitation.  Diet recommendation: Heart healthy, modified carbohydrates diet  Filed Weights   09/11/18 1535 09/11/18 2118 09/12/18 0939  Weight: 77.1 kg 76 kg 76 kg    History of present illness:  As per H&P written by Dr. Mariea ClontsEmokpae on 09/11/2018 85 y.o. male with medical history significant for remote stroke, hypertension, diabetes mellitus, who was brought to the ED after patient fell today.  Patient appears slightly confused-but per care everywhere on his visits, he is sometimes confused, but he is able to answer a few questions appropriately. Patient tells me he was trying to get out of his friend's car, when he fell.  He says he might have slipped.  He tells me he hit his head a bit.  He denies any chest pain or difficulty breathing.  Denies dizziness, cough, fever or chills, vomiting or loose stools.  ED Course: Blood pressure systolic 101-106, heart rate 70s to 80s.  Slight drop in hemoglobin 12.1 from 13.52 years ago otherwise unremarkable CBC, BMP.  EKG shows sinus tachycardia,.  Portable chest x-ray without acute abnormality.   Pelvic x-ray shows comminuted intertrochanteric fracture of the proximal left femur.  Hospitalist to admit for hip fracture.  Hospital Course:  1-left hip fracture -Status post surgical repair by Dr. [name] 729 -PT OT has been consulted and recommended a skilled nursing facility for rehabilitation and conditioning -Will continue the use of as needed pain medication -Stable vitals 2 weeks after surgery, x-rayed 6 weeks and 12 weeks with outpatient follow-up with Dr. [name] -Patient is currently receiving Eliquis and this will serve as a DVT prophylaxis.  2-history of CVA -No new focal deficits appreciated -Patient was using Aggrenox at home, this has been discontinued a change for Eliquis as part of secondary prophylaxis due to new onset of atrial fibrillation -Continue risk factor modification.  3-fever -Patient with 100.7 overnight temperature on 09/19/2018; nontoxic and in no acute source of infection appreciated -Hemodynamically stable -Most likely associated with blood transfusion. -He is over 24 hours fever free -COVID test negative.  4-new onset atrial fibrillation -Patient remains in atrial fibrillation as per telemetry evaluation -rate controlled currently -while using metoprolol he dropped HR into the mid 50's experienced 2 second second pause. -Continue Eliquis for secondary prevention -Reassess heart rate and if needed initiate lower dose metoprolol (maybe extended release b-blocker 12.5 mg)  5-hypertension -Blood pressure is soft but well controlled -Lisinopril discontinued at time of discharge -Continue the use of midodrine twice a day -Follow vital signs.  6-acute blood loss anemia  -patient with a hemoglobin postoperatively below 7 -Received 1 unit of PRBCs -Now stable and without signs of overt bleeding -Continue to monitor hemoglobin trend.  At discharge  8.7. -Patient asymptomatic -Continue ferrous sulfate twice a day.  9-history of BPH -Continue  Flomax.  10-type 2 diabetes mellitus -Continue modified carbohydrate diet -Resume the use of glipizide daily.  Procedures:  See below for x-ray reports  Surgical repair of acute left hip fracture on 728 by Dr. [name]  Consultations:  Orthopedic service  Discharge Exam: Vitals:   09/20/18 0605 09/20/18 1352  BP: (!) 100/59 105/64  Pulse: 73 62  Resp: 16 18  Temp: 98.7 F (37.1 C) 98.6 F (37 C)  SpO2: 98% 96%    General: Afebrile, alert, awake and oriented x3; normal affect and following commands appropriately.  Reports pain to be well controlled. Cardiovascular: S1 and S2, no rubs, no gallops, no murmurs.  No JVD Respiratory:Good air movement bilaterally, good oxygen saturation on room air, no using accessory muscles. Abdomen: Soft, nontender, nondistended, positive bowel sounds Extremities: No cyanosis, no clubbing.  Clean dressings appreciated at surgical site.  Discharge Instructions   Discharge Instructions    Diet - low sodium heart healthy   Complete by: As directed    Discharge instructions   Complete by: As directed    Take medications as prescribed Physical therapy as per the skilled nursing facility protocol Follow-up instructions for wound care and outpatient visit with orthopedic service Maintain adequate hydration Repeat basic metabolic panel in 5 days to follow electrolytes and renal function Repeat CBC in 5 days to follow hemoglobin trend.     Allergies as of 09/20/2018   No Known Allergies     Medication List    STOP taking these medications   dipyridamole-aspirin 200-25 MG 12hr capsule Commonly known as: AGGRENOX   lisinopril 10 MG tablet Commonly known as: ZESTRIL     TAKE these medications   apixaban 5 MG Tabs tablet Commonly known as: ELIQUIS Take 1 tablet (5 mg total) by mouth 2 (two) times daily.   docusate sodium 100 MG capsule Commonly known as: COLACE Take 1 capsule (100 mg total) by mouth 2 (two) times daily.   feeding  supplement (ENSURE ENLIVE) Liqd Take 237 mLs by mouth 2 (two) times daily between meals.   ferrous sulfate 325 (65 FE) MG tablet Take 1 tablet (325 mg total) by mouth 2 (two) times daily with a meal.   FOLBEE PO Take 1 tablet by mouth daily.   glipiZIDE 2.5 MG 24 hr tablet Commonly known as: GLUCOTROL XL Take 2.5 mg by mouth daily with breakfast.   HYDROcodone-acetaminophen 5-325 MG tablet Commonly known as: NORCO/VICODIN Take 1-2 tablets by mouth every 6 (six) hours as needed for up to 7 days for severe pain (pain score 4-6).   midodrine 5 MG tablet Commonly known as: PROAMATINE Take 1 tablet (5 mg total) by mouth 2 (two) times daily with a meal.   senna-docusate 8.6-50 MG tablet Commonly known as: Senokot-S Take 2 tablets by mouth 2 (two) times daily.   tamsulosin 0.4 MG Caps capsule Commonly known as: FLOMAX Take 1 capsule (0.4 mg total) by mouth daily after supper.      No Known Allergies  Contact information for follow-up providers    Joette CatchingNyland, Leonard, MD. Schedule an appointment as soon as possible for a visit in 2 week(s).   Specialty: Family Medicine Why: After discharge from the skilled nursing facility. Contact information: [address] [place] [address] [phone]        Vickki HearingHarrison, Stanley E, MD. Schedule an appointment as soon as possible for a visit in 10 day(s).  Specialties: Orthopedic Surgery, Radiology Contact information: [address] [place] [address] [phone]            Contact information for after-discharge care    Destination    [facility] .   Service: Skilled Nursing Contact information: [address] [place] [address] [phone]                   The results of significant diagnostics from this hospitalization (including imaging, microbiology, ancillary and laboratory) are listed below for reference.    Significant Diagnostic Studies: Dg Chest 1 View  Result Date:  09/11/2018 CLINICAL DATA:  Pre operative respiratory exam.  Left hip fracture. EXAM: CHEST  1 VIEW COMPARISON:  None. FINDINGS: The heart size and mediastinal contours are within normal limits. Both lungs are clear. The visualized skeletal structures are unremarkable. IMPRESSION: No active disease. Electronically Signed   By: Francene Boyers M.D.   On: 09/11/2018 16:43   Ct Head Wo Contrast  Result Date: 09/11/2018 CLINICAL DATA:  Fall. EXAM: CT HEAD WITHOUT CONTRAST TECHNIQUE: Contiguous axial images were obtained from the base of the skull through the vertex without intravenous contrast. COMPARISON:  Head MRI 08/14/2005 FINDINGS: Brain: There is no evidence of acute infarct, intracranial hemorrhage, mass, midline shift, or extra-axial fluid collection. Patchy to confluent hypodensities in the cerebral white matter bilaterally are nonspecific but compatible with moderate chronic small vessel ischemic disease, progressive from the 2007 MRI. There is mild cerebral atrophy. Focal encephalomalacia inferiorly in the left temporal lobe is new from 2007. Vascular: Calcified atherosclerosis at the skull base. No hyperdense vessel. Skull: No acute fracture. Chronic postoperative changes involving the frontal skull including frontal sinus. Sinuses/Orbits: No evidence of acute sinus disease. Bilateral cataract extraction. Other: None. IMPRESSION: 1. No evidence of acute intracranial abnormality. 2. Moderate chronic small vessel ischemic disease. 3. Focal encephalomalacia in the left temporal lobe, new from 2007 and possibly reflective of old trauma. Electronically Signed   By: Sebastian Ache M.D.   On: 09/11/2018 19:03   Dg Chest Port 1 View  Result Date: 09/19/2018 CLINICAL DATA:  85 year old male with a history fever and recent hip fracture EXAM: PORTABLE CHEST 1 VIEW COMPARISON:  September 11, 2018 FINDINGS: Cardiomediastinal silhouette unchanged in size and contour. No evidence of central vascular congestion. No  interlobular septal thickening. No pneumothorax or pleural effusion. Coarsened interstitial markings throughout with low lung volumes. No displaced fracture. Degenerative changes of the shoulders. IMPRESSION: Chronic lung changes without evidence of acute cardiopulmonary disease Electronically Signed   By: Gilmer Mor D.O.   On: 09/19/2018 09:22   Dg Chest Port 1 View  Result Date: 09/16/2018 CLINICAL DATA:  Postop fever. EXAM: PORTABLE CHEST 1 VIEW COMPARISON:  09/11/2018 FINDINGS: Cardiac silhouette is normal in size. No mediastinal or hilar masses. Mild opacity at the bases consistent with atelectasis. Lungs are otherwise clear. No convincing pleural effusion. No pneumothorax. Skeletal structures are grossly intact. IMPRESSION: No acute cardiopulmonary disease. Electronically Signed   By: Amie Portland M.D.   On: 09/16/2018 12:11   Dg Hip Operative Unilat With Pelvis Left  Result Date: 09/12/2018 CLINICAL DATA:  Portable fluoroscopic imaging for left hip ORIF. EXAM: OPERATIVE LEFT HIP (WITH PELVIS IF PERFORMED) 9 VIEWS TECHNIQUE: Fluoroscopic spot image(s) were submitted for interpretation post-operatively. COMPARISON:  None. FINDINGS: Submitted views show placement of a lateral fixation plate supporting a compression screw, reducing the intertrochanteric primary fracture components into near anatomic alignment. No evidence of  an operative complication. IMPRESSION: Well aligned proximal left femur fracture following ORIF. Electronically Signed   By: Amie Portlandavid  Ormond M.D.   On: 09/12/2018 15:17   Dg Hip Unilat W Or Wo Pelvis 2-3 Views Left  Result Date: 09/11/2018 CLINICAL DATA:  Left hip pain secondary to a fall today. EXAM: DG HIP (WITH OR WITHOUT PELVIS) 2-3V LEFT COMPARISON:  None. FINDINGS: There is a comminuted intertrochanteric fracture proximal left femur with slight distraction. Diffuse osteopenia. Superolateral joint space narrowing at the left hip. Pelvic bones are intact. IMPRESSION:  Comminuted intertrochanteric fracture of the proximal left femur. Electronically Signed   By: Francene BoyersJames  Maxwell M.D.   On: 09/11/2018 16:42    Microbiology: Recent Results (from the past 240 hour(s))  SARS Coronavirus 2 (CEPHEID - Performed in [facility] hospital lab), Hosp Order     Status: None   Collection Time: 09/11/18  3:57 PM   Specimen: Nasopharyngeal Swab  Result Value Ref Range Status   SARS Coronavirus 2 NEGATIVE NEGATIVE Final    Comment: (NOTE) If result is NEGATIVE SARS-CoV-2 target nucleic acids are NOT DETECTED. The SARS-CoV-2 RNA is generally detectable in upper and lower  respiratory specimens during the acute phase of infection. The lowest  concentration of SARS-CoV-2 viral copies this assay can detect is 250  copies / mL. A negative result does not preclude SARS-CoV-2 infection  and should not be used as the sole basis for treatment or other  patient management decisions.  A negative result may occur with  improper specimen collection / handling, submission of specimen other  than nasopharyngeal swab, presence of viral mutation(s) within the  areas targeted by this assay, and inadequate number of viral copies  (<250 copies / mL). A negative result must be combined with clinical  observations, patient history, and epidemiological information. If result is POSITIVE SARS-CoV-2 target nucleic acids are DETECTED. The SARS-CoV-2 RNA is generally detectable in upper and lower  respiratory specimens dur ing the acute phase of infection.  Positive  results are indicative of active infection with SARS-CoV-2.  Clinical  correlation with patient history and other diagnostic information is  necessary to determine patient infection status.  Positive results do  not rule out bacterial infection or co-infection with other viruses. If result is PRESUMPTIVE POSTIVE SARS-CoV-2 nucleic acids MAY BE PRESENT.   A presumptive positive result was obtained on the submitted specimen  and  confirmed on repeat testing.  While 2019 novel coronavirus  (SARS-CoV-2) nucleic acids may be present in the submitted sample  additional confirmatory testing may be necessary for epidemiological  and / or clinical management purposes  to differentiate between  SARS-CoV-2 and other Sarbecovirus currently known to infect humans.  If clinically indicated additional testing with an alternate test  methodology [phone]([id number]) is advised. The SARS-CoV-2 RNA is generally  detectable in upper and lower respiratory sp ecimens during the acute  phase of infection. The expected result is Negative. Fact Sheet for Patients:  BoilerBrush.com.cyhttps://www.fda.gov/media/136312/download Fact Sheet for Healthcare Providers: https://pope.com/https://www.fda.gov/media/136313/download This test is not yet approved or cleared by the [place] FDA and has been authorized for detection and/or diagnosis of SARS-CoV-2 by FDA under an Emergency Use Authorization (EUA).  This EUA will remain in effect (meaning this test can be used) for the duration of the COVID-19 declaration under Section 564(b)(1) of the Act, 21 U.S.C. section 360bbb-3(b)(1), unless the authorization is terminated or revoked sooner. Performed at [facility], [address] [place], [place] [address]   Surgical PCR  screen     Status: None   Collection Time: 09/11/18 10:50 PM   Specimen: Nasal Mucosa; Nasal Swab  Result Value Ref Range Status   MRSA, PCR NEGATIVE NEGATIVE Final   Staphylococcus aureus NEGATIVE NEGATIVE Final    Comment: (NOTE) The Xpert SA Assay (FDA approved for NASAL specimens in patients 28 years of age and older), is one component of a comprehensive surveillance program. It is not intended to diagnose infection nor to guide or monitor treatment. Performed at [facility], [address] [place], [place] [address]   SARS CORONAVIRUS 2 Nasal Swab Aptima Multi Swab     Status: None   Collection Time: 09/19/18 10:41 AM   Specimen: Aptima Multi Swab; Nasal  Swab  Result Value Ref Range Status   SARS Coronavirus 2 NEGATIVE NEGATIVE Final    Comment: (NOTE) SARS-CoV-2 target nucleic acids are NOT DETECTED. The SARS-CoV-2 RNA is generally detectable in upper and lower respiratory specimens during the acute phase of infection. Negative results do not preclude SARS-CoV-2 infection, do not rule out co-infections with other pathogens, and should not be used as the sole basis for treatment or other patient management decisions. Negative results must be combined with clinical observations, patient history, and epidemiological information. The expected result is Negative. Fact Sheet for Patients: SugarRoll.be Fact Sheet for Healthcare Providers: https://www.woods-mathews.com/ This test is not yet approved or cleared by the [place] FDA and  has been authorized for detection and/or diagnosis of SARS-CoV-2 by FDA under an Emergency Use Authorization (EUA). This EUA will remain  in effect (meaning this test can be used) for the duration of the COVID-19 declaration under Section 56 4(b)(1) of the Act, 21 U.S.C. section 360bbb-3(b)(1), unless the authorization is terminated or revoked sooner. Performed at [facility], [place] [address], [place], [place] [address]      Labs: Basic Metabolic Panel: Recent Labs  Lab 09/14/18 0450 09/15/18 0445 09/16/18 0602 09/17/18 0609 09/18/18 0329  NA 137 137 139 139 141  K 4.2 3.7 3.7 3.6 3.5  CL 106 109 108 109 113*  CO2 24 23 22 22 23   GLUCOSE 110* 106* 133* 138* 114*  BUN 35* 37* 33* 32* 30*  CREATININE 1.31* 1.11 0.95 0.90 0.82  CALCIUM 7.7* 7.2* 7.6* 7.5* 7.4*   CBC: Recent Labs  Lab 09/15/18 0445 09/16/18 0602 09/17/18 0609 09/18/18 0329 09/19/18 0726  WBC 9.0 8.9 9.4 7.1 8.0  HGB 7.7* 9.1* 8.7* 8.1* 8.0*  HCT 23.1* 27.3* 26.5* 24.8* 24.4*  MCV 102.2* 98.9 98.9 100.0 100.8*  PLT 109* 140* 155 151 166   CBG: Recent Labs  Lab  09/19/18 1131 09/19/18 1624 09/19/18 2147 09/20/18 0744 09/20/18 1055  GLUCAP 157* 115* 101* 86 102*    Signed:  Barton Dubois MD.  [facility] 09/20/2018, 1:58 PM

## 2018-09-20 NOTE — Care Management Important Message (Signed)
Important Message  Patient Details  Name: Shannon STEMMER Sr. MRN: [id number] Date of Birth: 14-Jun-1933   Medicare Important Message Given:  Yes     Tommy Medal 09/20/2018, 2:37 PM

## 2018-09-20 NOTE — Progress Notes (Signed)
Transported to [employer] called and given to [employer] LPN. Transported via [facility] [address]..

## 2018-09-28 ENCOUNTER — Telehealth: Payer: Self-pay | Admitting: Orthopedic Surgery

## 2018-09-28 NOTE — Telephone Encounter (Signed)
I received a call from [place] at [facility] regarding this patient.   Shannon Cooke is post surgical repair of acute left hip fracture done on 09/12/18.  She wanted to know when we had an appointment scheduled for this patient.  I did not see that an appointment had been made.  I checked the discharge summary and it stated that Shannon Cooke needed to be seen in 10 days.   Do you want to have Dr Luna Glasgow see this patient next week or do you want to see him when you come back on or around the 24th?  Please let me know  Thanks

## 2018-09-29 NOTE — Telephone Encounter (Signed)
I called and spoke to Pricilla Holm, treatment nurse at [facility].  I relayed Dr. Ruthe Mannan message.  Appointment for 10/13/18 at 11:40 was given at that time

## 2018-09-29 NOTE — Telephone Encounter (Signed)
Remove staples today   Fu 28th when I get back

## 2018-10-13 ENCOUNTER — Telehealth: Payer: Self-pay | Admitting: Orthopedic Surgery

## 2018-10-13 ENCOUNTER — Ambulatory Visit: Payer: Medicare Other | Admitting: Orthopedic Surgery

## 2018-10-13 NOTE — Telephone Encounter (Signed)
I spoke to Lamount Cohen at [facility] at [phone].  She states that Mr. Tanney is not at their facility right now.  He has been transferred to their [place] unit at [facility] in [place], [place].  Arbie Cookey states that if and when Mr. Rack comes back to their facility, they will get him rescheduled here.  She said they did send all paperwork to the new facility with the patient.

## 2018-11-16 DEATH — deceased

## 2021-03-02 IMAGING — DX CHEST  1 VIEW
1 series · 1 of 1 positions shown · non-contrast
Comparison: None.

CLINICAL DATA: Pre operative respiratory exam.  Left hip fracture.

EXAM:
CHEST  1 VIEW

[chest ap]
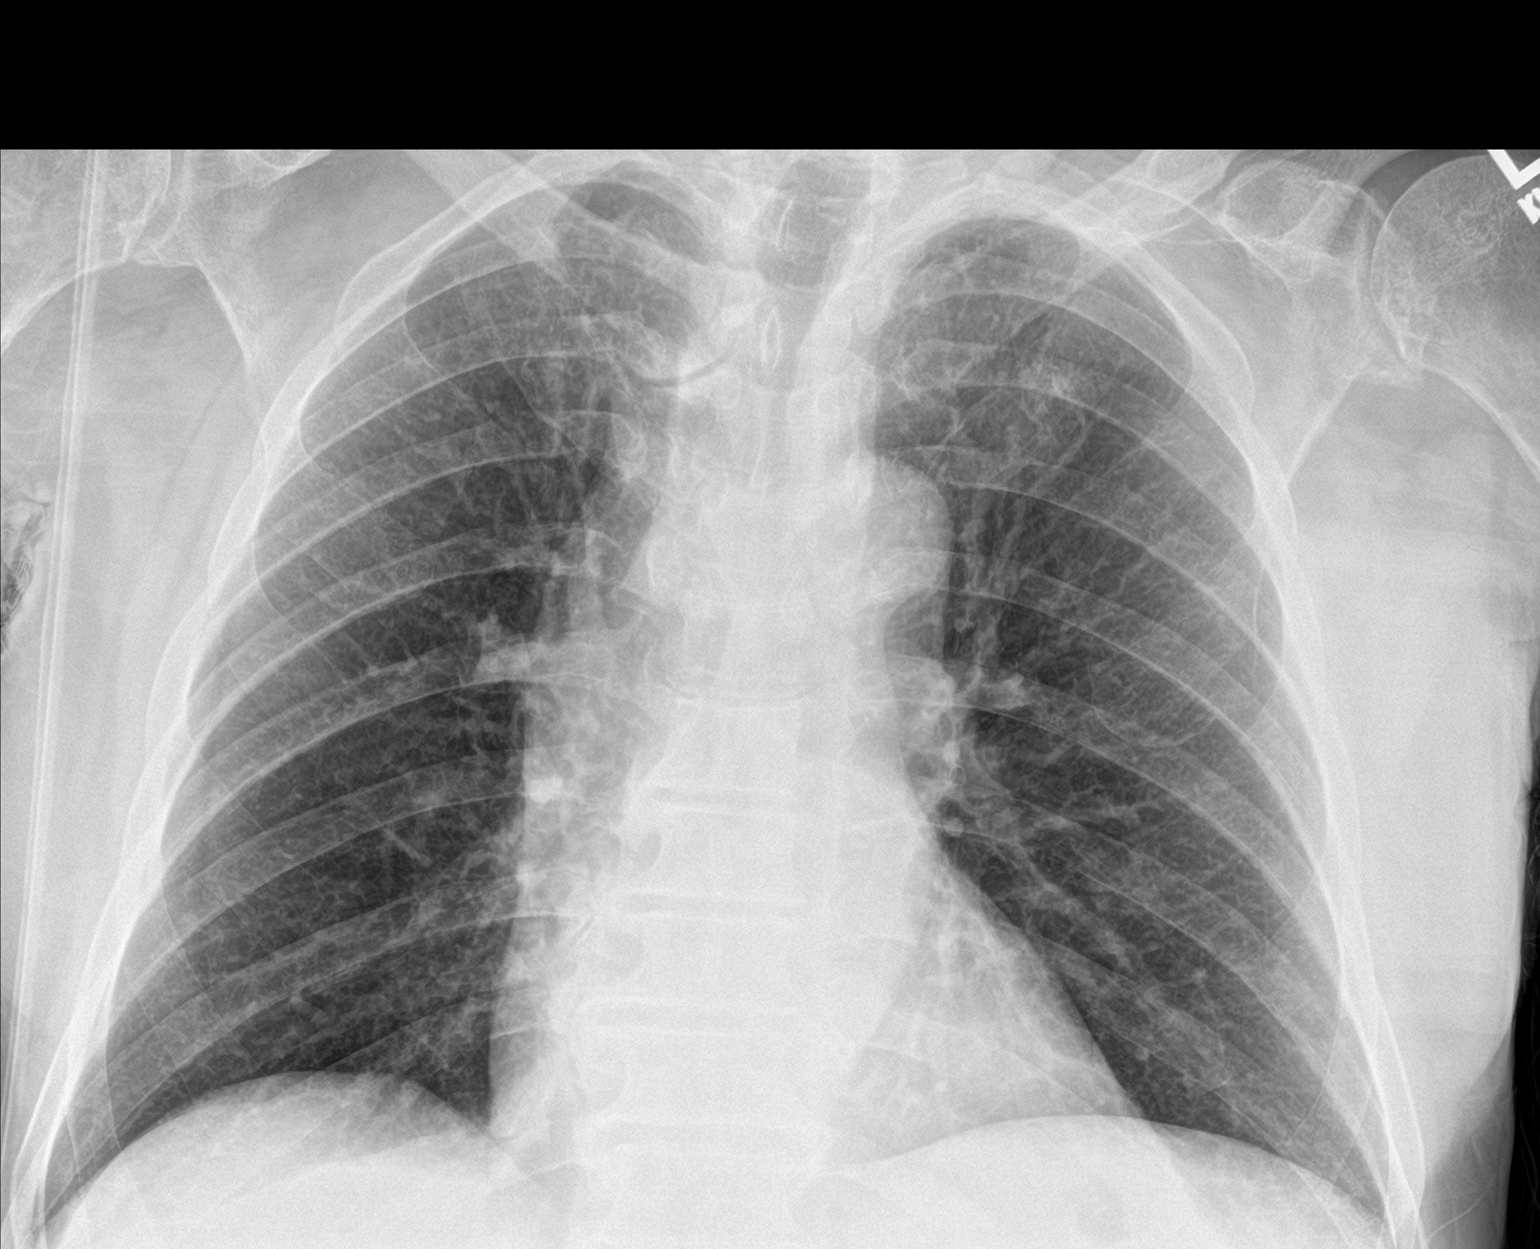

[1 of 1 positions shown; findings below may reference images not displayed]

FINDINGS: The heart size and mediastinal contours are within normal limits.
Both lungs are clear. The visualized skeletal structures are
unremarkable.
IMPRESSION: No active disease.
